# Patient Record
Sex: Female | Born: 1970 | Race: White | Hispanic: No | Marital: Married | Smoking: Never smoker
Health system: Southern US, Community
[De-identification: ages and names within clinical notes are randomized; demographics above are authoritative.]

## PROBLEM LIST (undated history)

## (undated) DIAGNOSIS — R112 Nausea with vomiting, unspecified: Secondary | ICD-10-CM

## (undated) DIAGNOSIS — Z9889 Other specified postprocedural states: Secondary | ICD-10-CM

## (undated) DIAGNOSIS — I499 Cardiac arrhythmia, unspecified: Secondary | ICD-10-CM

## (undated) DIAGNOSIS — D649 Anemia, unspecified: Secondary | ICD-10-CM

## (undated) DIAGNOSIS — R0602 Shortness of breath: Secondary | ICD-10-CM

## (undated) DIAGNOSIS — R011 Cardiac murmur, unspecified: Secondary | ICD-10-CM

## (undated) DIAGNOSIS — K219 Gastro-esophageal reflux disease without esophagitis: Secondary | ICD-10-CM

## (undated) DIAGNOSIS — R51 Headache: Secondary | ICD-10-CM

## (undated) HISTORY — PX: OTHER SURGICAL HISTORY: SHX169

## (undated) HISTORY — PX: COLONOSCOPY: SHX174

## (undated) HISTORY — PX: WISDOM TOOTH EXTRACTION: SHX21

## (undated) HISTORY — PX: BARTHOLIN GLAND CYST EXCISION: SHX565

## (undated) HISTORY — PX: CHOLECYSTECTOMY: SHX55

## (undated) HISTORY — PX: UPPER GI ENDOSCOPY: SHX6162

## (undated) HISTORY — PX: TUBAL LIGATION: SHX77

## (undated) HISTORY — PX: SPHINCTEROTOMY: SHX5279

---

## 1998-02-06 ENCOUNTER — Other Ambulatory Visit: Admission: RE | Admit: 1998-02-06 | Discharge: 1998-02-06 | Payer: Self-pay | Admitting: *Deleted

## 1999-03-13 ENCOUNTER — Other Ambulatory Visit: Admission: RE | Admit: 1999-03-13 | Discharge: 1999-03-13 | Payer: Self-pay | Admitting: Obstetrics and Gynecology

## 2000-03-05 ENCOUNTER — Other Ambulatory Visit: Admission: RE | Admit: 2000-03-05 | Discharge: 2000-03-05 | Payer: Self-pay | Admitting: Obstetrics and Gynecology

## 2000-08-17 ENCOUNTER — Emergency Department (HOSPITAL_COMMUNITY): Admission: EM | Admit: 2000-08-17 | Discharge: 2000-08-17 | Payer: Self-pay | Admitting: *Deleted

## 2001-05-27 ENCOUNTER — Other Ambulatory Visit: Admission: RE | Admit: 2001-05-27 | Discharge: 2001-05-27 | Payer: Self-pay | Admitting: Obstetrics and Gynecology

## 2002-11-27 ENCOUNTER — Other Ambulatory Visit: Admission: RE | Admit: 2002-11-27 | Discharge: 2002-11-27 | Payer: Self-pay | Admitting: Obstetrics and Gynecology

## 2003-12-12 ENCOUNTER — Other Ambulatory Visit: Admission: RE | Admit: 2003-12-12 | Discharge: 2003-12-12 | Payer: Self-pay | Admitting: Obstetrics and Gynecology

## 2004-09-08 ENCOUNTER — Ambulatory Visit (HOSPITAL_COMMUNITY): Admission: RE | Admit: 2004-09-08 | Discharge: 2004-09-08 | Payer: Self-pay | Admitting: Obstetrics and Gynecology

## 2005-02-12 ENCOUNTER — Other Ambulatory Visit: Admission: RE | Admit: 2005-02-12 | Discharge: 2005-02-12 | Payer: Self-pay | Admitting: Obstetrics and Gynecology

## 2006-01-30 ENCOUNTER — Emergency Department (HOSPITAL_COMMUNITY): Admission: EM | Admit: 2006-01-30 | Discharge: 2006-01-30 | Payer: Self-pay | Admitting: Emergency Medicine

## 2006-03-04 ENCOUNTER — Ambulatory Visit (HOSPITAL_COMMUNITY): Admission: RE | Admit: 2006-03-04 | Discharge: 2006-03-04 | Payer: Self-pay | Admitting: Obstetrics and Gynecology

## 2008-09-11 ENCOUNTER — Ambulatory Visit (HOSPITAL_COMMUNITY): Admission: RE | Admit: 2008-09-11 | Discharge: 2008-09-11 | Payer: Self-pay | Admitting: Podiatry

## 2009-06-18 ENCOUNTER — Encounter (INDEPENDENT_AMBULATORY_CARE_PROVIDER_SITE_OTHER): Payer: Self-pay | Admitting: *Deleted

## 2009-07-03 ENCOUNTER — Ambulatory Visit: Payer: Self-pay | Admitting: Gastroenterology

## 2009-07-03 DIAGNOSIS — K625 Hemorrhage of anus and rectum: Secondary | ICD-10-CM

## 2009-07-03 DIAGNOSIS — R1013 Epigastric pain: Secondary | ICD-10-CM

## 2009-07-03 DIAGNOSIS — K3189 Other diseases of stomach and duodenum: Secondary | ICD-10-CM

## 2009-07-19 ENCOUNTER — Ambulatory Visit (HOSPITAL_COMMUNITY): Admission: RE | Admit: 2009-07-19 | Discharge: 2009-07-19 | Payer: Self-pay | Admitting: Gastroenterology

## 2009-07-19 ENCOUNTER — Ambulatory Visit: Payer: Self-pay | Admitting: Gastroenterology

## 2009-08-05 ENCOUNTER — Telehealth (INDEPENDENT_AMBULATORY_CARE_PROVIDER_SITE_OTHER): Payer: Self-pay

## 2009-09-25 ENCOUNTER — Ambulatory Visit: Payer: Self-pay | Admitting: Gastroenterology

## 2009-09-25 DIAGNOSIS — K602 Anal fissure, unspecified: Secondary | ICD-10-CM | POA: Insufficient documentation

## 2009-09-26 ENCOUNTER — Encounter: Payer: Self-pay | Admitting: Gastroenterology

## 2009-10-31 ENCOUNTER — Telehealth (INDEPENDENT_AMBULATORY_CARE_PROVIDER_SITE_OTHER): Payer: Self-pay | Admitting: *Deleted

## 2009-12-10 ENCOUNTER — Ambulatory Visit (HOSPITAL_COMMUNITY): Admission: RE | Admit: 2009-12-10 | Discharge: 2009-12-10 | Payer: Self-pay | Admitting: General Surgery

## 2010-01-12 ENCOUNTER — Emergency Department (HOSPITAL_COMMUNITY): Admission: EM | Admit: 2010-01-12 | Discharge: 2010-01-12 | Payer: Self-pay | Admitting: Emergency Medicine

## 2010-02-20 ENCOUNTER — Encounter (INDEPENDENT_AMBULATORY_CARE_PROVIDER_SITE_OTHER): Payer: Self-pay | Admitting: *Deleted

## 2010-05-26 ENCOUNTER — Encounter: Payer: Self-pay | Admitting: Podiatry

## 2010-06-05 NOTE — Assessment & Plan Note (Signed)
Summary: [name] BLEEDING,GERD   Visit Type:  Initial Consult Referring Provider:  Sherwood Gambler Primary Care Provider:  Phillips Odor, M.D.  Chief Complaint:  abd pain/rectal bleed/gerd.  History of Present Illness: Several months has been using TUMS. Feels like has gas all the time. Chest pain and upr abd pain Epigastric/Chest pain: sharp, no vomiting, mild nausea. No precipitating event: ?stress. No known triggers: ? pizza. Trying to avoid greasy and spicy foods.  Rx for H. pylori gastritis -Biaxin/AMOXIcillin/Aciphex x 7 days. Took every single pill. No problems swallowing. Heartburn better with Aciphex. Carafate actually not taking.  DEC 2010: chest hurt to her back and felt like having heart attack x2. Went on mission trip to [place] but didn't drink the water, but did eat the food.  Happened in [place] and at home. Drinking caffeinated coffee in [place].  Blood in stool every time she goes. Has rectal itching. Doesn't feel any hemorrhoids, ? skin tag. Rare problem with constipation. No straining to have BM.  Rectal bleeding associated with ripping tearing pain. Seen in ED 1 year ago. Rx: Proctofoam. Only uses Tylenol for pain. Ate eggs, bacon, and applesauce-->had to go to [place] and nl stool/bloody.  Preventive Screening-Counseling & Management  Alcohol-Tobacco     Smoking Status: never  Current Medications (verified): 1)  Toviaz 4 Mg Xr24h-Tab (Fesoterodine Fumarate) .... Take 1 Tablet By Mouth Once A Day 2)  Lexapro 10 Mg Tabs (Escitalopram Oxalate) .... Take 1 Tablet By Mouth Once A Day 3)  Aciphex 20 Mg Tbec (Rabeprazole Sodium) .... Take 1 Tablet By Mouth Two Times A Day 4)  Carafate 1 Gm/27ml Susp (Sucralfate) .... Take Two Teaspoons At Bedtime 5)  Chromium .... One Daily  Allergies (verified): 1)  ! Sulfa  Past History:  Past Medical History: Overactive Bladder/weak pelvic muscles "ADD tendencies", Lexapro helps  Past Surgical History: Tubal  Ligation Cholecystectomy-biliary dyskinesia, no stones: Dr. 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  Working on [employer] in [place] since 2008.  Husband had gastric bypass. Smoking Status:  never  Review of Systems       Per HPI, otherwise all systems negative.  No SOB or cough, hemoptysis, diarrhea, dysuria, hematuria, DUB. No ASA, BC, or Goodys, Ibuprofen, Motrin, or Aleve.  Vital Signs:  Patient profile:   40 year old female Height:      63.5 inches Weight:      144 pounds BMI:     25.20 Temp:     98.0 degrees F oral Pulse rate:   80 / minute BP sitting:   100 / 60  (left arm) Cuff size:   regular  Vitals Entered By: Cloria Spring LPN (July 04, 2839 9:17 AM)  Physical Exam  General:  Well developed, well nourished, no acute distress. Head:  Normocephalic and atraumatic. Eyes:  PERRLA, no icterus. Mouth:  No deformity or lesions, dentition normal. Neck:  Supple; no masses. Lungs:  Clear throughout to auscultation. Heart:  Regular rate and rhythm; no murmurs Abdomen:  Soft, mild tenderness in all 4 quadrants without guarding and without rebound and nondistended. No masses or hernias noted. Normal bowel  sounds. Msk:  Symmetrical with no gross deformities. Normal posture. Extremities:  No cyanosis, or edema  noted. Neurologic:  Alert and  oriented x4;  grossly normal neurologically.  Impression & Recommendations:  Problem # 1:  DYSPEPSIA (ICD-536.8)  Differential diagnosis includes reflux esophagitis/uncontrolled GERD, H. pylori gastritis, less likely gastric adeno CA, gastric carcinoid, MALT  lymphoma or eosinophilic gastritis. Follow REFLUX recommendations. HO given. Take Aciphex two times a day. Use Carafate as needed. Plan upr endoscopy on MAR 18. Continue Lexapro. Return visit 2 months.  Orders: Consultation Level V (30865)  Problem # 2:  RECTAL BLEEDING (ICD-569.3)  Most likely 2o to anal fissure and less likely colorectal polyp, malignancy, or hemorrhoids. Use Anamantle two times a day for 14 days. Repeat for 14 additional days if symptoms not resolved. ADD COLACE two times a day to soften stool. AVOID PUSHING STOOL OUT. DO NOT SIT ON TOILET TO READ.Plan lwr endoscopy on MAR 18. Return visit 2 months.  CC: PCP  Total time: 40 minutes for H&P, and explaining etiology of Sx and discussing management of Sx.  Orders: Consultation Level V [phone])  Patient Instructions: 1)  Use Anamantle two times a day for 14 days. 2)  Repeat for 14 additional days if symptoms not resolved. 3)  ADD COLACE two times a day to soften stool. 4)  AVOID PUSHING STOOL OUT. 5)  DO NOT SIT ON TOILET TO READ. 6)  Follow REFLUX recommendations. 7)  Take Aciphex two times a day. 8)  Use Carafate as needed. 9)  Plan upr/lwr endoscopy on MAR 18. 10)  Return visit 2 months. 11)  The medication list was reviewed and reconciled.  All changed / newly prescribed medications were explained.  A complete medication list was provided to the patient / caregiver. Prescriptions: ANAMANTLE HC 3-0.5 % CREA (LIDOCAINE-HYDROCORTISONE ACE) apply to anal canal two times a day for 14 days and may repeat if symptoms not resolved after 14 days  #28 x 1   Entered and Authorized by:   West Bali MD   Signed by:   West Bali MD on 07/03/2009   Method used:   Electronically to        [facility] (retail)       [address] [place], [place]  [address]       Ph: [phone]       Fax: [phone]   RxID:   [id number]

## 2010-06-05 NOTE — Letter (Signed)
Summary: Recall Office Visit  [facility]  [address]   [place], [place] [address]   Phone: [phone]  Fax: [phone]      February 20, 2010   [facility] [address] [place], [place]  [address] [birth year]   Dear Ms. Torrez,   According to our records, it is time for you to schedule a follow-up office visit with [place].   At your convenience, please call [phone] to schedule an office visit. If you have any questions, concerns, or feel that this letter is in error, we would appreciate your call.   Sincerely,    Rosine Beat  [facility] Ph: [phone]   Fax: [phone]

## 2010-06-05 NOTE — Progress Notes (Signed)
Summary: change in appts, not suppose to see Dr. Fernande Boyden  Phone Note From Other Clinic   Caller: Receptionist Reason for Call: Need Referral Information, Schedule Patient Appt Details for Reason: appears we requested pt. appt w/ Dr. Mariel Sleet in error Summary of Call: Spoke with Marcelino Duster at Dr. Thornton Papas office.  She stated that we did notify her that there was a scheduling error; however, she had not been successful in reaching the patient to let her know not to come.  I tried calling the patient to let her know also and LMOVM for her to call back to confirm Initial call taken by: Minna Merritts,  October 31, 2009 4:36 PM

## 2010-06-05 NOTE — Letter (Signed)
Summary: Appointment Reminder  [facility]  [address]   [place], [place] [address]   Phone: [phone]  Fax: [phone]       June 18, 2009   [facility] [address] [place], [place]  [address] 25-Jul-1970    Dear [name],  We have been unable to reach you by phone to schedule a follow up   appointment that was recommended for you by Dr. Darrick Penna. It is very   important that we reach you to schedule an appointment. We hope that you  allow [place] to participate in your health care needs. Please contact us at  [phone] at your earliest convenience to schedule your appointment.  Sincerely,    [facility] R. Roetta Sessions, M.D.    Kassie Mends, M.D. Lorenza Burton, FNP-BC    Tana Coast, PA-C Phone: [phone]    Fax: [phone]

## 2010-06-05 NOTE — Progress Notes (Signed)
Summary: phone message  Phone Note Call from Patient   Caller: Patient Summary of Call: Pt called to say she has completed antibiotics. Wants to know is there anything she needs to do before her appt with Dr. Darrick Penna on 09/25/2009.   Also wants to know if she can have an Rx for Diflucan called to [place]. Said she has the beginnings of a yeast infection from the antibiotics.  Initial call taken by: Cloria Spring LPN,  August 05, 2009 1:52 PM     Appended Document: phone message Please call pt. For the H.pylori gastritis, she should follow the diet associated with improving reflux. Continue Aciphex. Will call in DIFLUCAN 150 mg once, #1 rfxo for her yeast infection. No interaction with her citalopram if she is just taking one dose. (Discusswed with pharmacist, Lorin Picket)  If her rectal Sx are not completely resolved then she should continue the Colace twice a day.  Drink 6-8 cups of water daily.  And repeat Anusol-HC suppositories 1 per rectum twice daily for an additional 14 days. She should follow high-fiber diet. Then she should follow up in MAY 2011. If she thinks she needs to be seen sooner, please call after APR 25. We need to give tghe second course of Anusol HC time to work.  Appended Document: phone message Pt informed. Rx's called to [facility], to April.

## 2010-06-05 NOTE — Letter (Signed)
Summary: SURGICAL REFERRAL  SURGICAL REFERRAL   Imported By: Ave Filter 09/26/2009 11:00:41  _____________________________________________________________________  External Attachment:    Type:   Image     Comment:   External Document  Appended Document: SURGICAL REFERRAL Pt aware.

## 2010-06-05 NOTE — Assessment & Plan Note (Signed)
Summary: ANAL FISSURE, DYSPEPSIA   Visit Type:  Follow-up Visit Primary Care Provider:  Phillips Odor, M.D.  Chief Complaint:  F/U dyspepsia/ rectal bleeding.  History of Present Illness: Increasing fiber. Still having a tear. Even painful with diarrhea.  Still having pain in rectum passing stool after AnaMantale for 14 days and Anusol HC for 14 days. Anusol HC gave her terrible gas. Dyspepsia and upr discomfort is gone.   Current Medications (verified): 1)  Toviaz 4 Mg Xr24h-Tab (Fesoterodine Fumarate) .... Take 1 Tablet By Mouth Once A Day 2)  Lexapro 10 Mg Tabs (Escitalopram Oxalate) .... Take 1 Tablet By Mouth Once A Day 3)  Aciphex 20 Mg Tbec (Rabeprazole Sodium) .... Take 1 Tablet By Mouth Two Times A Day 4)  Carafate 1 Gm/30ml Susp (Sucralfate) .... As Needed 5)  Chromium .... One Daily  Allergies (verified): 1)  ! Sulfa  Past History:  Past Medical History: Last updated: 07/03/2009 Overactive Bladder/weak pelvic muscles "ADD tendencies", Lexapro helps  Past Surgical History: Reviewed history from 07/03/2009 and no changes required. Tubal Ligation Cholecystectomy-biliary dyskinesia, no stones: Dr. Maple [place]  Vital Signs:  Patient profile:   40 year old female Height:      63.5 inches Weight:      147.50 pounds BMI:     25.81 Temp:     97.9 degrees F oral Pulse rate:   72 / minute BP sitting:   100 / 60  (left arm) Cuff size:   regular  Vitals Entered By: Cloria Spring LPN (Sep 25, 2009 4:05 PM)  Physical Exam  General:  Well developed, well nourished, no acute distress. Head:  Normocephalic and atraumatic. Eyes:  PERRLA, no icterus. Lungs:  Clear throughout to auscultation. Heart:  Regular rate and rhythm; no murmurs. Abdomen:  Soft, MILD TTP IN RIGHT PERIMUMBILICAL REGION, nondistended. Normal bowel sounds.  Impression & Recommendations:  Problem # 1:  ANAL FISSURE (ICD-565.0)  Failed medical management. TCS up to date. Refer for fissurectomy:  Jenkins/Ziegler JULY 2011 per pt request. May use Viscous Lidocaine prior to BMs to decrease pain. No need to bulk up stools at this time. discuss NTG ointment and side effects. Pt declines rx at this time. OPV in 6 mos.  CC: PCP and Dr. Leticia Penna  Orders: Est. Patient Level III ([address])  Problem # 2:  DYSPEPSIA (ICD-536.8) Assessment: Improved Coninue Aciphex and Carafate as needed. Prescriptions: LIDOCAINE VISCOUS 2 % SOLN (LIDOCAINE HCL) 1/2 tsp q2-4 hours top to anus as needed for rectal pain  #50 ml x 1   Entered and Authorized by:   West Bali MD   Signed by:   West Bali MD on 09/25/2009   Method used:   Electronically to        [facility] (retail)       [address] [place], [place]  [address]       Ph: [phone]       Fax: [phone]   RxID:   [id number]   Appended Document: ANAL FISSURE, DYSPEPSIA REMINDER IN COMPUTER

## 2010-06-05 NOTE — Letter (Signed)
Summary: TCS/EGD ORDER  TCS/EGD ORDER   Imported By: Ave Filter 07/03/2009 10:06:56  _____________________________________________________________________  External Attachment:    Type:   Image     Comment:   External Document

## 2010-07-18 LAB — CBC
MCH: 28.2 pg (ref 26.0–34.0)
MCV: 82.2 fL (ref 78.0–100.0)
Platelets: 277 10*3/uL (ref 150–400)
RBC: 4.26 MIL/uL (ref 3.87–5.11)
RDW: 12.7 % (ref 11.5–15.5)

## 2010-07-18 LAB — BASIC METABOLIC PANEL
BUN: 10 mg/dL (ref 6–23)
CO2: 27 mEq/L (ref 19–32)
Chloride: 105 mEq/L (ref 96–112)
Creatinine, Ser: 0.8 mg/dL (ref 0.4–1.2)

## 2010-07-18 LAB — DIFFERENTIAL
Eosinophils Absolute: 0.1 10*3/uL (ref 0.0–0.7)
Eosinophils Relative: 1 % (ref 0–5)
Lymphs Abs: 2.5 10*3/uL (ref 0.7–4.0)

## 2010-09-19 NOTE — Op Note (Signed)
NAMEJALAH, [name]            ACCOUNT NO.:  [id number]   MEDICAL RECORD NO.:  [id number]          PATIENT TYPE:  AMB   LOCATION:  SDC                           [facility]  [facility]   PHYSICIAN:  Duke Salvia. Marcelle Overlie, M.D.DATE OF BIRTH:  07/21/1970   DATE OF PROCEDURE:  09/08/2004  DATE OF DISCHARGE:                                 OPERATIVE REPORT   PREOPERATIVE DIAGNOSIS:  Request permanent sterilization.   POSTOPERATIVE DIAGNOSIS:  Request permanent sterilization.   PROCEDURE:  Laparoscopic tubal by Filshie clip application.   SURGEON:  Duke Salvia. Marcelle Overlie, M.D.   ANESTHESIA:  General endotracheal.   COMPLICATIONS:  None.   DRAINS:  In-and-out Foley catheter.   ESTIMATED BLOOD LOSS:  Minimal.   DESCRIPTION OF PROCEDURE:  The patient was taken to the operating room and  after an adequate level of general endotracheal anesthesia was obtained with  the patient's legs in stirrups, the abdomen, perineum, and vagina were  prepped and draped in the usual sterile fashion for a laparoscopy.  The  bladder was drained.  EUA carried out.  Uterus midposition and normal size.  Adnexa negative.  Hulka tenaculum was positioned.  Attention directed to the  abdomen.  The subumbilical area was infiltrated with 0.5% Marcaine plain.  Small incision was made.  The Veress needle introduced without difficulty.  It's intra-abdominal position was verified by pressure and water testing.  After 2.5 liter pneumoperitoneum was then created, the laparoscopic trocar  and sleeve were then introduced without difficulty.  Initial inspection  revealed no abnormalities noted in the pelvis.  With the patient in  Trendelenburg and the uterus anteflexed, 4 to 5 mL of 0.5% Marcaine plain  were then dripped across each tube from the cornu to the fimbriated end.  Filshie clip applicator was back-loaded.  Starting on the right, Filshie  clip was applied at a right angle 2 cm from the cornu on the right with  excellent  application.  The exact same repeated on the left.  Careful  inspection on the left, the entire tube was not engulfed into the clip, so a  second application was placed proximal with complete occlusion of the tube  with excellent application.  Both tubes were inspected and noted to be  excellent application on either side.  No other abnormalities were noted.  Instruments were removed.  Photographs were taken.  The gas was allowed to  escape.  The defect was closed with 4-0 Dexon subcuticular suture.  She  tolerated this well and went to the recovery room in good condition.    RMH/MEDQ  D:  09/08/2004  T:  09/08/2004  Job:  [id number]

## 2010-09-19 NOTE — Op Note (Signed)
[name], [name]            ACCOUNT NO.:  [id number]   MEDICAL RECORD NO.:  [id number]          PATIENT TYPE:  AMB   LOCATION:  SDC                           [facility]   PHYSICIAN:  Duke Salvia. Marcelle Overlie, M.D.DATE OF BIRTH:  [birth year]   DATE OF PROCEDURE:  03/04/2006  DATE OF DISCHARGE:                                 OPERATIVE REPORT   PREOPERATIVE DIAGNOSIS:  Recurrent left Bartholin's cyst/abscess.   POSTOPERATIVE DIAGNOSIS:  Recurrent left Bartholin's cyst/abscess.   PROCEDURE:  Marsupialization of left Bartholin cyst/abscess.   SURGEON:  Duke Salvia. Marcelle Overlie, M.D.   ANESTHESIA:  Local plus general.   ESTIMATED BLOOD LOSS:  Minimal.   SPECIMENS:  None.   PROCEDURE AND FINDINGS:  The patient was taken to the operating room.  After  an adequate level of general anesthesia was obtained with the patient's legs  in stirrups, the perineum and vagina was prepped with Betadine.  The bladder  was drained.  A 2-3 cm palpable left Bartholin's cyst was infiltrated with  0.5% Marcaine plain.  A #11 blade was then used to incise the mucocutaneous  margin for approximately 2 cm draining some old purulent material.  There  was no odor.  The cavity was explored and noted to be just a unilocular  cyst.  The cyst wall was grasped with hemostats and was suture  circumferentially with 3-0 Vicryl suture to the external skin.  Packed with  Iodoform.  She tolerated this well and went to the recovery room in good  condition.      Richard M. Marcelle Overlie, M.D.  Electronically Signed     RMH/MEDQ  D:  03/04/2006  T:  03/04/2006  Job:  [id number]

## 2010-09-19 NOTE — H&P (Signed)
[name], [name]            ACCOUNT NO.:  [id number]   MEDICAL RECORD NO.:  [id number]          PATIENT TYPE:  AMB   LOCATION:  SDC                           [facility]   PHYSICIAN:  Duke Salvia. Marcelle Overlie, M.D.DATE OF BIRTH:  Oct 05, 1970   DATE OF ADMISSION:  DATE OF DISCHARGE:                                HISTORY & PHYSICAL   DATE OF OUTPATIENT SURGERY:  Sep 08, 2004   CHIEF COMPLAINT:  Requests permanent sterilization.   HISTORY OF PRESENT ILLNESS:  A 40 year old G2 P2 who has a 16- and 9-year-  old presents for LTC, currently using Ortho Evra for contraception. This  procedure including the risks of bleeding, infection, adjacent organ injury,  the possible need for open or additional surgery, the permanence of the  procedure, and failure rate of two to three per thousand are reviewed with  her, which she understands and accepts.   PAST MEDICAL HISTORY:  Allergies:  None. Obstetric history:  Two vaginal  deliveries at term without complication. Other surgery:  None.   HISTORY:  Significant for a father with diabetes, hypertension, and heart  disease. Also, father with prostate cancer.   PHYSICAL EXAMINATION:  VITAL SIGNS:  Temperature 98.2, blood pressure 86/60.  HEENT:  Unremarkable.  NECK:  Supple without masses.  LUNGS:  Clear.  CARDIOVASCULAR:  Regular rate and rhythm without murmurs, rubs, or gallops  noted.  BREASTS:  Without masses.  ABDOMEN:  Soft, flat, nontender.  PELVIC:  Normal external genitalia, vagina and cervix clear. Uterus mid  position, normal size. Adnexa negative.  EXTREMITIES AND NEUROLOGIC:  Unremarkable.   IMPRESSION:  Requests LTC.   PLAN:  Tubal ligation by Filshie clip application. Procedure and risks  reviewed as above.      RMH/MEDQ  D:  09/02/2004  T:  09/02/2004  Job:  [id number]

## 2010-09-19 NOTE — H&P (Signed)
[name], [name]            ACCOUNT NO.:  [id number]   MEDICAL RECORD NO.:  [id number]          PATIENT TYPE:  AMB   LOCATION:  SDC                           [facility]   PHYSICIAN:  Duke Salvia. Marcelle Overlie, M.D.DATE OF BIRTH:  1970/05/09   DATE OF ADMISSION:  DATE OF DISCHARGE:                                HISTORY & PHYSICAL   CHIEF COMPLAINT:  Recurrent left Bartholin's cyst.   HISTORY OF PRESENT ILLNESS:  A 40 year old who has had a prior tubal and  recently had a left Bartholin's abscess/cyst and was treated with a Word  catheter, but has recurred causing discomfort and not responding to oral  antibiotics.  She presents for outpatient marsupialization of the  Bartholin's cyst.   PAST OBSTETRICAL HISTORY:  She is a gravida 2, para 2.  She has had a prior  tubal.   ALLERGIES:  SULFA.   PAST SURGICAL HISTORY:  Laparoscopic cholecystectomy.   FAMILY HISTORY:  Significant for father with prostate cancer, hypertension,  and heart disease.   PHYSICAL EXAMINATION:  VITAL SIGNS:  Temperature 98.2, blood pressure  120/70.  HEENT:  Unremarkable.  NECK:  Supple without masses.  LUNGS:  Clear.  CARDIOVASCULAR:  Regular rate and rhythm without murmurs, rubs, or gallops  noted.  BREASTS:  Without masses.  ABDOMEN:  Soft, flat, and nontender.  PELVIC:  External genitalia reveals a draining 2 cm left Bartholin's cyst.  Vagina and cervix normal.  Uterus midposition and normal size.  Adnexa  negative.  EXTREMITIES:  NEUROLOGY:  Unremarkable.   IMPRESSION:  Recurrent left Bartholin's cyst.   PLAN:  Outpatient marsupialization.  The procedure including risks of  bleeding, infection, and recurrence of the cyst were all reviewed with her  which she understands and accepts.      Richard M. Marcelle Overlie, M.D.  Electronically Signed     RMH/MEDQ  D:  03/02/2006  T:  03/02/2006  Job:  [id number]

## 2011-09-24 ENCOUNTER — Other Ambulatory Visit: Payer: Self-pay | Admitting: Obstetrics and Gynecology

## 2011-11-16 ENCOUNTER — Other Ambulatory Visit: Payer: Self-pay | Admitting: Obstetrics and Gynecology

## 2011-11-16 DIAGNOSIS — R928 Other abnormal and inconclusive findings on diagnostic imaging of breast: Secondary | ICD-10-CM

## 2011-11-23 ENCOUNTER — Ambulatory Visit
Admission: RE | Admit: 2011-11-23 | Discharge: 2011-11-23 | Disposition: A | Discharge status: Home / Self Care | Payer: 59 | Source: Ambulatory Visit | Attending: Obstetrics and Gynecology | Admitting: Obstetrics and Gynecology

## 2011-11-23 ENCOUNTER — Other Ambulatory Visit: Payer: Self-pay | Admitting: Obstetrics and Gynecology

## 2011-11-23 DIAGNOSIS — R928 Other abnormal and inconclusive findings on diagnostic imaging of breast: Secondary | ICD-10-CM

## 2011-11-30 ENCOUNTER — Ambulatory Visit
Admission: RE | Admit: 2011-11-30 | Discharge: 2011-11-30 | Disposition: A | Discharge status: Home / Self Care | Payer: 59 | Source: Ambulatory Visit | Attending: Obstetrics and Gynecology | Admitting: Obstetrics and Gynecology

## 2011-11-30 DIAGNOSIS — R928 Other abnormal and inconclusive findings on diagnostic imaging of breast: Secondary | ICD-10-CM

## 2012-11-11 ENCOUNTER — Other Ambulatory Visit: Payer: Self-pay

## 2012-11-11 DIAGNOSIS — Z1231 Encounter for screening mammogram for malignant neoplasm of breast: Secondary | ICD-10-CM

## 2012-12-05 ENCOUNTER — Ambulatory Visit: Payer: 59

## 2012-12-15 ENCOUNTER — Ambulatory Visit
Admission: RE | Admit: 2012-12-15 | Discharge: 2012-12-15 | Disposition: A | Discharge status: Home / Self Care | Payer: 59 | Source: Ambulatory Visit

## 2012-12-15 DIAGNOSIS — Z1231 Encounter for screening mammogram for malignant neoplasm of breast: Secondary | ICD-10-CM

## 2013-05-22 ENCOUNTER — Encounter (HOSPITAL_COMMUNITY): Payer: Self-pay | Admitting: Emergency Medicine

## 2013-05-22 ENCOUNTER — Emergency Department (HOSPITAL_COMMUNITY)
Admission: EM | Admit: 2013-05-22 | Discharge: 2013-05-22 | Disposition: A | Discharge status: Home / Self Care | Payer: 59 | Attending: Emergency Medicine | Admitting: Emergency Medicine

## 2013-05-22 DIAGNOSIS — Z79899 Other long term (current) drug therapy: Secondary | ICD-10-CM | POA: Insufficient documentation

## 2013-05-22 DIAGNOSIS — R112 Nausea with vomiting, unspecified: Secondary | ICD-10-CM

## 2013-05-22 DIAGNOSIS — B349 Viral infection, unspecified: Secondary | ICD-10-CM

## 2013-05-22 DIAGNOSIS — R630 Anorexia: Secondary | ICD-10-CM | POA: Insufficient documentation

## 2013-05-22 DIAGNOSIS — H9209 Otalgia, unspecified ear: Secondary | ICD-10-CM | POA: Insufficient documentation

## 2013-05-22 DIAGNOSIS — B9789 Other viral agents as the cause of diseases classified elsewhere: Secondary | ICD-10-CM | POA: Insufficient documentation

## 2013-05-22 DIAGNOSIS — R109 Unspecified abdominal pain: Secondary | ICD-10-CM | POA: Insufficient documentation

## 2013-05-22 DIAGNOSIS — R6889 Other general symptoms and signs: Secondary | ICD-10-CM

## 2013-05-22 DIAGNOSIS — M2550 Pain in unspecified joint: Secondary | ICD-10-CM | POA: Insufficient documentation

## 2013-05-22 DIAGNOSIS — J111 Influenza due to unidentified influenza virus with other respiratory manifestations: Secondary | ICD-10-CM | POA: Insufficient documentation

## 2013-05-22 LAB — COMPREHENSIVE METABOLIC PANEL
ALK PHOS: 58 U/L (ref 39–117)
ALT: 26 U/L (ref 0–35)
AST: 28 U/L (ref 0–37)
Albumin: 4.5 g/dL (ref 3.5–5.2)
BILIRUBIN TOTAL: 0.9 mg/dL (ref 0.3–1.2)
BUN: 14 mg/dL (ref 6–23)
CO2: 24 meq/L (ref 19–32)
Calcium: 9.7 mg/dL (ref 8.4–10.5)
Chloride: 100 mEq/L (ref 96–112)
Creatinine, Ser: 0.8 mg/dL (ref 0.50–1.10)
GFR, EST NON AFRICAN AMERICAN: 88 mL/min — AB (ref >90)
GLUCOSE: 91 mg/dL (ref 70–99)
POTASSIUM: 3.6 meq/L — AB (ref 3.7–5.3)
SODIUM: 139 meq/L (ref 137–147)
Total Protein: 7.9 g/dL (ref 6.0–8.3)

## 2013-05-22 LAB — URINALYSIS, ROUTINE W REFLEX MICROSCOPIC
Glucose, UA: NEGATIVE mg/dL
Hgb urine dipstick: NEGATIVE
KETONES UR: NEGATIVE mg/dL
LEUKOCYTES UA: NEGATIVE
NITRITE: NEGATIVE
PROTEIN: NEGATIVE mg/dL
Specific Gravity, Urine: 1.038 — ABNORMAL HIGH (ref 1.005–1.030)
UROBILINOGEN UA: 1 mg/dL (ref 0.0–1.0)
pH: 6.5 (ref 5.0–8.0)

## 2013-05-22 LAB — CBC
HEMATOCRIT: 41.7 % (ref 36.0–46.0)
HEMOGLOBIN: 13.9 g/dL (ref 12.0–15.0)
MCH: 28.2 pg (ref 26.0–34.0)
MCHC: 33.3 g/dL (ref 30.0–36.0)
MCV: 84.6 fL (ref 78.0–100.0)
Platelets: 243 10*3/uL (ref 150–400)
RBC: 4.93 MIL/uL (ref 3.87–5.11)
RDW: 12.9 % (ref 11.5–15.5)
WBC: 9.1 10*3/uL (ref 4.0–10.5)

## 2013-05-22 MED ORDER — SODIUM CHLORIDE 0.9 % IV BOLUS (SEPSIS)
1000.0000 mL | Freq: Once | INTRAVENOUS | Status: AC
  Administered 2013-05-22: 1000 mL via INTRAVENOUS

## 2013-05-22 MED ORDER — ONDANSETRON 4 MG PO TBDP: ORAL_TABLET | ORAL | Status: DC

## 2013-05-22 MED ORDER — ONDANSETRON HCL 4 MG/2ML IJ SOLN
4.0000 mg | Freq: Once | INTRAMUSCULAR | Status: AC
  Administered 2013-05-22: 4 mg via INTRAVENOUS
  Filled 2013-05-22: qty 2

## 2013-05-22 NOTE — ED Notes (Signed)
Pt has had fever and diarrhea since yesterday.  Spouse treated recently with flu also.  Pt c/o generalized body aches and pain.

## 2013-05-22 NOTE — Discharge Instructions (Signed)
Use zofran as directed as needed for nausea. Stay well hydrated.  Nausea and Vomiting Nausea is a sick feeling that often comes before throwing up (vomiting). Vomiting is a reflex where stomach contents come out of your mouth. Vomiting can cause severe loss of body fluids (dehydration). Children and elderly adults can become dehydrated quickly, especially if they also have diarrhea. Nausea and vomiting are symptoms of a condition or disease. It is important to find the cause of your symptoms. CAUSES   Direct irritation of the stomach lining. This irritation can result from increased acid production (gastroesophageal reflux disease), infection, food poisoning, taking certain medicines (such as nonsteroidal anti-inflammatory drugs), alcohol use, or tobacco use.  Signals from the brain.These signals could be caused by a headache, heat exposure, an inner ear disturbance, increased pressure in the brain from injury, infection, a tumor, or a concussion, pain, emotional stimulus, or metabolic problems.  An obstruction in the gastrointestinal tract (bowel obstruction).  Illnesses such as diabetes, hepatitis, gallbladder problems, appendicitis, kidney problems, cancer, sepsis, atypical symptoms of a heart attack, or eating disorders.  Medical treatments such as chemotherapy and radiation.  Receiving medicine that makes you sleep (general anesthetic) during surgery. DIAGNOSIS Your caregiver may ask for tests to be done if the problems do not improve after a few days. Tests may also be done if symptoms are severe or if the reason for the nausea and vomiting is not clear. Tests may include:  Urine tests.  Blood tests.  Stool tests.  Cultures (to look for evidence of infection).  X-rays or other imaging studies. Test results can help your caregiver make decisions about treatment or the need for additional tests. TREATMENT You need to stay well hydrated. Drink frequently but in small amounts.You  may wish to drink water, sports drinks, clear broth, or eat frozen ice pops or gelatin dessert to help stay hydrated.When you eat, eating slowly may help prevent nausea.There are also some antinausea medicines that may help prevent nausea. HOME CARE INSTRUCTIONS   Take all medicine as directed by your caregiver.  If you do not have an appetite, do not force yourself to eat. However, you must continue to drink fluids.  If you have an appetite, eat a normal diet unless your caregiver tells you differently.  Eat a variety of complex carbohydrates (rice, wheat, potatoes, bread), lean meats, yogurt, fruits, and vegetables.  Avoid high-fat foods because they are more difficult to digest.  Drink enough water and fluids to keep your urine clear or pale yellow.  If you are dehydrated, ask your caregiver for specific rehydration instructions. Signs of dehydration may include:  Severe thirst.  Dry lips and mouth.  Dizziness.  Dark urine.  Decreasing urine frequency and amount.  Confusion.  Rapid breathing or pulse. SEEK IMMEDIATE MEDICAL CARE IF:   You have blood or brown flecks (like coffee grounds) in your vomit.  You have black or bloody stools.  You have a severe headache or stiff neck.  You are confused.  You have severe abdominal pain.  You have chest pain or trouble breathing.  You do not urinate at least once every 8 hours.  You develop cold or clammy skin.  You continue to vomit for longer than 24 to 48 hours.  You have a fever. MAKE SURE YOU:   Understand these instructions.  Will watch your condition.  Will get help right away if you are not doing well or get worse. Document Released: 04/20/2005 Document Revised: 07/13/2011 Document Reviewed:  09/17/2010 [employer] Patient Information 2014 [place], [place].  Viral Infections A viral infection can be caused by different types of viruses.Most viral infections are not serious and resolve on their own.  However, some infections may cause severe symptoms and may lead to further complications. SYMPTOMS Viruses can frequently cause:  Minor sore throat.  Aches and pains.  Headaches.  Runny nose.  Different types of rashes.  Watery eyes.  Tiredness.  Cough.  Loss of appetite.  Gastrointestinal infections, resulting in nausea, vomiting, and diarrhea. These symptoms do not respond to antibiotics because the infection is not caused by bacteria. However, you might catch a bacterial infection following the viral infection. This is sometimes called a "superinfection." Symptoms of such a bacterial infection may include:  Worsening sore throat with pus and difficulty swallowing.  Swollen neck glands.  Chills and a high or persistent fever.  Severe headache.  Tenderness over the sinuses.  Persistent overall ill feeling (malaise), muscle aches, and tiredness (fatigue).  Persistent cough.  Yellow, green, or brown mucus production with coughing. HOME CARE INSTRUCTIONS   Only take over-the-counter or prescription medicines for pain, discomfort, diarrhea, or fever as directed by your caregiver.  Drink enough water and fluids to keep your urine clear or pale yellow. Sports drinks can provide valuable electrolytes, sugars, and hydration.  Get plenty of rest and maintain proper nutrition. Soups and broths with crackers or rice are fine. SEEK IMMEDIATE MEDICAL CARE IF:   You have severe headaches, shortness of breath, chest pain, neck pain, or an unusual rash.  You have uncontrolled vomiting, diarrhea, or you are unable to keep down fluids.  You or your child has an oral temperature above 102 F (38.9 C), not controlled by medicine.  Your baby is older than 3 months with a rectal temperature of 102 F (38.9 C) or higher.  Your baby is 72 months old or younger with a rectal temperature of 100.4 F (38 C) or higher. MAKE SURE YOU:   Understand these instructions.  Will watch  your condition.  Will get help right away if you are not doing well or get worse. Document Released: 01/28/2005 Document Revised: 07/13/2011 Document Reviewed: 08/25/2010 [facility] Patient Information 2014 [place], [place].

## 2013-05-22 NOTE — ED Provider Notes (Signed)
CSN: [id number]     Arrival date & time 05/22/13  1137 History   First MD Initiated Contact with Patient 05/22/13 1208     Chief Complaint  Patient presents with  . Fever  . Diarrhea   (Consider location/radiation/quality/duration/timing/severity/associated sxs/prior Treatment) HPI Comments: Pt is a 43 y/o female who presents to the ED complaining of fever, nausea, vomiting, diarrhea and body aches x 2 days. Pt states between yesterday and today she had a fever of 101, vomited about 6-7 times, had a few epodes of non-bloody diarrhea. Feels as if her abdomen is cramping. She has been taking tylenol with temporary relief of fever. Also states she has been congested, has pressure behind her sinuses, ear fullness. Her husband was recently told he had the flu. Denies chest pain, sob, cough.  Patient is a 43 y.o. female presenting with fever and diarrhea. The history is provided by the patient and the spouse.  Fever Associated symptoms: congestion, diarrhea, ear pain, myalgias, nausea and vomiting   Diarrhea Associated symptoms: abdominal pain, arthralgias, fever, myalgias and vomiting     History reviewed. No pertinent past medical history. Past Surgical History  Procedure Laterality Date  . Cholecystectomy    . Tubal ligation    . Bartholin gland cyst excision    . Uterine ablation     History reviewed. No pertinent family history. History  Substance Use Topics  . Smoking status: Never Smoker   . Smokeless tobacco: Not on file  . Alcohol Use: Yes     Comment: social   OB History   Grav Para Term Preterm Abortions TAB SAB Ect Mult Living                 Review of Systems  Constitutional: Positive for fever and appetite change.  HENT: Positive for congestion, ear pain and sinus pressure.   Gastrointestinal: Positive for nausea, vomiting, abdominal pain and diarrhea.  Musculoskeletal: Positive for arthralgias and myalgias.  All other systems reviewed and are  negative.    Allergies  Sulfonamide derivatives  Home Medications   Current Outpatient Rx  Name  Route  Sig  Dispense  Refill  . acetaminophen (TYLENOL) 500 MG tablet   Oral   Take 1,000 mg by mouth every 6 (six) hours as needed.         Marland Kitchen tetrahydrozoline (VISINE) 0.05 % ophthalmic solution   Both Eyes   Place 1 drop into both eyes 2 (two) times daily.         . ondansetron (ZOFRAN ODT) 4 MG disintegrating tablet      4mg  ODT q4 hours prn nausea/vomit   10 tablet   0    BP 94/62  Pulse 80  Temp(Src) 98.6 F (37 C) (Oral)  Resp 16  SpO2 100% Physical Exam  Nursing note and vitals reviewed. Constitutional: She is oriented to person, place, and time. She appears well-developed and well-nourished. No distress.  HENT:  Head: Normocephalic and atraumatic.  Right Ear: Tympanic membrane and ear canal normal.  Left Ear: Tympanic membrane and ear canal normal.  Nose: Mucosal edema present. Right sinus exhibits maxillary sinus tenderness and frontal sinus tenderness. Left sinus exhibits maxillary sinus tenderness and frontal sinus tenderness.  Mouth/Throat: Uvula is midline and mucous membranes are normal. Posterior oropharyngeal erythema present. No oropharyngeal exudate or posterior oropharyngeal edema.  Eyes: Conjunctivae are normal. No scleral icterus.  Neck: Normal range of motion. Neck supple.  Cardiovascular: Normal rate, regular rhythm, normal heart sounds and  intact distal pulses.   Pulmonary/Chest: Effort normal and breath sounds normal. No respiratory distress. She has no wheezes. She has no rales. She exhibits no tenderness.  Abdominal: Soft. Normal appearance and bowel sounds are normal. She exhibits no distension. There is tenderness (mild). There is no rigidity, no rebound and no guarding.  No peritoneal signs.  Musculoskeletal: Normal range of motion. She exhibits no edema.  Neurological: She is alert and oriented to person, place, and time.  Skin: Skin is  warm and dry. She is not diaphoretic.  Psychiatric: She has a normal mood and affect. Her behavior is normal.    ED Course  Procedures (including critical care time) Labs Review Labs Reviewed  COMPREHENSIVE METABOLIC PANEL - Abnormal; Notable for the following:    Potassium 3.6 (*)    GFR calc non Af Amer 88 (*)    All other components within normal limits  URINALYSIS, ROUTINE W REFLEX MICROSCOPIC - Abnormal; Notable for the following:    Color, Urine AMBER (*)    Specific Gravity, Urine 1.038 (*)    Bilirubin Urine SMALL (*)    All other components within normal limits  CBC   Imaging Review No results found.  EKG Interpretation   None       MDM   1. Flu-like symptoms   2. Nausea and vomiting   3. Viral syndrome     Pt presenting with flu-like symptoms, n/v/d, congestion, sinus pressure. She is well appearing and in NAD, normal VS. Plan to give IV fluids, zofran. Labs pending. 3:23 PM Labs without acute finding. UA showing slight dehydration. Slight improvement with IV fluids. Vitals remain stable. She is stable for discharge. Symptomatic tx discussed. Will d/c with zofran. Return precautions given. Patient states understanding of treatment care plan and is agreeable.   Trevor MaceRobyn M Albert, PA-C 05/22/13 1525

## 2013-05-22 NOTE — ED Provider Notes (Signed)
Medical screening examination/treatment/procedure(s) were performed by non-physician practitioner and as supervising physician I was immediately available for consultation/collaboration.  EKG Interpretation   None        Gilda Creasehristopher J. Estefania Kamiya, MD 05/22/13 [phone]

## 2013-07-26 NOTE — H&P (Signed)
Delton SeeKimberly J Josephs  DICTATION # [id number] CSN# [id number]   Meriel PicaHOLLAND,Marcos Peloso M, MD 07/26/2013 9:54 AM

## 2013-07-27 ENCOUNTER — Encounter (HOSPITAL_COMMUNITY): Payer: Self-pay | Admitting: Pharmacist

## 2013-07-28 NOTE — H&P (Signed)
NAMChristiane Allen:  Allen, Beverly            ACCOUNT NO.:  [id number]  MEDICAL RECORD NO.:  [id number]  LOCATION:                                 FACILITY:  PHYSICIAN:  Duke Salviaichard M. Marcelle OverlieHolland, M.D.DATE OF BIRTH:  04/10/1971  DATE OF ADMISSION:  08/07/2013 DATE OF DISCHARGE:                             HISTORY & PHYSICAL   CHIEF COMPLAINT:  Pelvic pain, dyspareunia.  HISTORY OF PRESENT ILLNESS:  A 43 year old, G2, P2, prior tubal, this patient underwent endometrial ablation about a year ago, did well initially, but over the last 4-6 months has experienced increased pain during her period.  One day of heavier flow and deep pain with intercourse.  Followup exam in our office at 2:15, uterus is mid position, normal size, adnexa negative.  Of note at the time of her last ultrasound July 13, 2013, had some changes of possible adenomyosis. Her pain has progressed to the point that she prefers to have definitive hysterectomy.  The procedure of LAVH discussed in detail including specific risks related to bleeding, infection, transfusion, adjacent organ injury, wound infection, phlebitis, possible need to complete the surgery by open technique along with her expected recovery time reviewed.  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  CURRENT MEDICATIONS:  None.  SURGICAL HISTORY:  Two vaginal deliveries, cholecystectomy, marsupialization of the Bartholin's gland, tubal ligation, NovaSure ablation.  FAMILY HISTORY:  Significant for heart disease asthma, anemia, kidney disease, UTI, osteoporosis, arthritis, diabetes, hypertension, bladder, lung, and prostate cancer.  SOCIAL HISTORY:  Denies tobacco or drug use.  She does drink 1 alcoholic drink every other day.  Dr. Assunta FoundJohn Golding is her medical doctor.  Last Pap January 2015 was normal.  PHYSICAL EXAMINATION:  VITAL SIGNS:  Temp 98.2, blood pressure 120/72. HEENT:  Unremarkable. NECK:  Supple without masses. LUNGS:  Clear. CARDIOVASCULAR:   Regular rate and rhythm without murmurs, rubs, or gallops. BREASTS:  Without masses. ABDOMEN:  Soft, flat, nontender. GU:  Vulva, vagina, cervix normal.  Uterus mid position, normal size. Adnexa negative.  EXTREMITIES:  Unremarkable. NEUROLOGIC:  Unremarkable.  IMPRESSION:  Pelvic pain, dyspareunia status post endometrial ablation, possible adenomyosis.  PLAN:  LAVH, procedure and risks reviewed as above.     Beren Yniguez M. Marcelle OverlieHolland, M.D.     RMH/MEDQ  D:  07/26/2013  T:  07/26/2013  Job:  [id number]

## 2013-08-02 ENCOUNTER — Other Ambulatory Visit (HOSPITAL_COMMUNITY): Payer: 59

## 2013-08-02 ENCOUNTER — Encounter (HOSPITAL_COMMUNITY)
Admission: RE | Admit: 2013-08-02 | Discharge: 2013-08-02 | Disposition: A | Discharge status: Home / Self Care | Payer: 59 | Source: Ambulatory Visit | Attending: Obstetrics and Gynecology | Admitting: Obstetrics and Gynecology

## 2013-08-02 ENCOUNTER — Encounter (HOSPITAL_COMMUNITY): Payer: Self-pay

## 2013-08-02 DIAGNOSIS — Z01812 Encounter for preprocedural laboratory examination: Secondary | ICD-10-CM | POA: Insufficient documentation

## 2013-08-02 HISTORY — DX: Anemia, unspecified: D64.9

## 2013-08-02 HISTORY — DX: Headache: R51

## 2013-08-02 HISTORY — DX: Cardiac arrhythmia, unspecified: I49.9

## 2013-08-02 HISTORY — DX: Gastro-esophageal reflux disease without esophagitis: K21.9

## 2013-08-02 HISTORY — DX: Cardiac murmur, unspecified: R01.1

## 2013-08-02 HISTORY — DX: Other specified postprocedural states: R11.2

## 2013-08-02 HISTORY — DX: Shortness of breath: R06.02

## 2013-08-02 HISTORY — DX: Nausea with vomiting, unspecified: Z98.890

## 2013-08-02 LAB — CBC
HCT: 36.7 % (ref 36.0–46.0)
Hemoglobin: 12.7 g/dL (ref 12.0–15.0)
MCH: 29.5 pg (ref 26.0–34.0)
MCHC: 34.6 g/dL (ref 30.0–36.0)
MCV: 85.3 fL (ref 78.0–100.0)
PLATELETS: 273 10*3/uL (ref 150–400)
RBC: 4.3 MIL/uL (ref 3.87–5.11)
RDW: 12.9 % (ref 11.5–15.5)
WBC: 7.7 10*3/uL (ref 4.0–10.5)

## 2013-08-02 NOTE — Patient Instructions (Signed)
Your procedure is scheduled on: Monday, August 07, 2013  [facility] at: 6:00am  Pick up the phone at the desk and dial [phone].  Call this number if you have problems the morning of surgery: [phone].  Remember: Do NOT eat food: After midnight Sunday Do NOT drink clear liquids after: After midnight Sunday Take these medicines the morning of surgery with a SIP OF WATER: NONE  Do NOT wear jewelry (body piercing), metal hair clips/bobby pins, make-up, or nail polish. Do NOT wear lotions, powders, or perfumes.  You may wear deoderant. Do NOT shave for 48 hours prior to surgery. Do NOT bring valuables to the hospital. Contacts, dentures, or bridgework may not be worn into surgery. Leave suitcase in car.  After surgery it may be brought to your room.  For patients admitted to the hospital, checkout time is 11:00 AM the day of discharge.

## 2013-08-06 MED ORDER — CEFOTETAN DISODIUM 2 G IJ SOLR
2.0000 g | INTRAMUSCULAR | Status: AC
  Administered 2013-08-07: 2 g via INTRAVENOUS
  Filled 2013-08-06: qty 2

## 2013-08-07 ENCOUNTER — Ambulatory Visit (HOSPITAL_COMMUNITY): Payer: 59 | Admitting: Anesthesiology

## 2013-08-07 ENCOUNTER — Inpatient Hospital Stay (HOSPITAL_COMMUNITY)
Admission: RE | Admit: 2013-08-07 | Discharge: 2013-08-08 | DRG: 743 | Disposition: A | Discharge status: Home / Self Care | Payer: 59 | Source: Ambulatory Visit | Attending: Obstetrics and Gynecology | Admitting: Obstetrics and Gynecology

## 2013-08-07 ENCOUNTER — Encounter (HOSPITAL_COMMUNITY)
Admission: RE | Disposition: A | Discharge status: Home / Self Care | Payer: Self-pay | Source: Ambulatory Visit | Attending: Obstetrics and Gynecology

## 2013-08-07 ENCOUNTER — Encounter (HOSPITAL_COMMUNITY): Payer: 59 | Admitting: Anesthesiology

## 2013-08-07 ENCOUNTER — Encounter (HOSPITAL_COMMUNITY): Payer: Self-pay | Admitting: Anesthesiology

## 2013-08-07 DIAGNOSIS — N949 Unspecified condition associated with female genital organs and menstrual cycle: Principal | ICD-10-CM | POA: Diagnosis present

## 2013-08-07 DIAGNOSIS — N809 Endometriosis, unspecified: Secondary | ICD-10-CM

## 2013-08-07 DIAGNOSIS — N8000 Endometriosis of the uterus, unspecified: Secondary | ICD-10-CM | POA: Diagnosis present

## 2013-08-07 DIAGNOSIS — IMO0002 Reserved for concepts with insufficient information to code with codable children: Secondary | ICD-10-CM | POA: Diagnosis present

## 2013-08-07 DIAGNOSIS — N8003 Adenomyosis of the uterus: Secondary | ICD-10-CM | POA: Diagnosis present

## 2013-08-07 DIAGNOSIS — Z882 Allergy status to sulfonamides status: Secondary | ICD-10-CM

## 2013-08-07 DIAGNOSIS — N72 Inflammatory disease of cervix uteri: Secondary | ICD-10-CM | POA: Diagnosis present

## 2013-08-07 HISTORY — PX: LAPAROSCOPIC ASSISTED VAGINAL HYSTERECTOMY: SHX5398

## 2013-08-07 LAB — PREGNANCY, URINE: Preg Test, Ur: NEGATIVE

## 2013-08-07 SURGERY — HYSTERECTOMY, VAGINAL, LAPAROSCOPY-ASSISTED
Anesthesia: General

## 2013-08-07 MED ORDER — NALOXONE HCL 0.4 MG/ML IJ SOLN
0.4000 mg | INTRAMUSCULAR | Status: DC | PRN
Start: 2013-08-07 — End: 2013-08-08

## 2013-08-07 MED ORDER — BUPIVACAINE HCL (PF) 0.25 % IJ SOLN
INTRAMUSCULAR | Status: DC | PRN
  Administered 2013-08-07: 6 mL

## 2013-08-07 MED ORDER — SCOPOLAMINE 1 MG/3DAYS TD PT72
MEDICATED_PATCH | TRANSDERMAL | Status: AC
  Administered 2013-08-07: 1.5 mg via TRANSDERMAL
  Filled 2013-08-07: qty 1

## 2013-08-07 MED ORDER — PROPOFOL 10 MG/ML IV EMUL
INTRAVENOUS | Status: AC
  Filled 2013-08-07: qty 20

## 2013-08-07 MED ORDER — KETOROLAC TROMETHAMINE 30 MG/ML IJ SOLN
INTRAMUSCULAR | Status: AC
  Filled 2013-08-07: qty 1

## 2013-08-07 MED ORDER — LACTATED RINGERS IV SOLN
INTRAVENOUS | Status: DC
  Administered 2013-08-07 (×2): via INTRAVENOUS

## 2013-08-07 MED ORDER — NEOSTIGMINE METHYLSULFATE 1 MG/ML IJ SOLN
INTRAMUSCULAR | Status: DC | PRN
  Administered 2013-08-07: 3 mg via INTRAVENOUS

## 2013-08-07 MED ORDER — BUTORPHANOL TARTRATE 1 MG/ML IJ SOLN: 1.0000 mg | INTRAMUSCULAR | Status: DC | PRN

## 2013-08-07 MED ORDER — ROCURONIUM BROMIDE 100 MG/10ML IV SOLN
INTRAVENOUS | Status: AC
  Filled 2013-08-07: qty 1

## 2013-08-07 MED ORDER — KETOROLAC TROMETHAMINE 30 MG/ML IJ SOLN
30.0000 mg | Freq: Four times a day (QID) | INTRAMUSCULAR | Status: DC
  Administered 2013-08-07 – 2013-08-08 (×3): 30 mg via INTRAVENOUS
  Filled 2013-08-07 (×4): qty 1

## 2013-08-07 MED ORDER — MEPERIDINE HCL 25 MG/ML IJ SOLN: 6.2500 mg | INTRAMUSCULAR | Status: DC | PRN

## 2013-08-07 MED ORDER — LACTATED RINGERS IR SOLN
Status: DC | PRN
  Administered 2013-08-07: 3000 mL

## 2013-08-07 MED ORDER — FENTANYL CITRATE 0.05 MG/ML IJ SOLN
INTRAMUSCULAR | Status: DC | PRN
  Administered 2013-08-07 (×5): 50 ug via INTRAVENOUS

## 2013-08-07 MED ORDER — SCOPOLAMINE 1 MG/3DAYS TD PT72
1.0000 | MEDICATED_PATCH | TRANSDERMAL | Status: DC
  Administered 2013-08-07: 1.5 mg via TRANSDERMAL

## 2013-08-07 MED ORDER — OXYCODONE-ACETAMINOPHEN 5-325 MG PO TABS
1.0000 | ORAL_TABLET | ORAL | Status: DC | PRN
  Administered 2013-08-08: 1 via ORAL
  Filled 2013-08-07: qty 1

## 2013-08-07 MED ORDER — GLYCOPYRROLATE 0.2 MG/ML IJ SOLN
INTRAMUSCULAR | Status: AC
  Filled 2013-08-07: qty 2

## 2013-08-07 MED ORDER — ONDANSETRON HCL 4 MG/2ML IJ SOLN: 4.0000 mg | Freq: Four times a day (QID) | INTRAMUSCULAR | Status: DC | PRN

## 2013-08-07 MED ORDER — NEOSTIGMINE METHYLSULFATE 1 MG/ML IJ SOLN
INTRAMUSCULAR | Status: AC
  Filled 2013-08-07: qty 1

## 2013-08-07 MED ORDER — DIPHENHYDRAMINE HCL 50 MG/ML IJ SOLN
12.5000 mg | Freq: Four times a day (QID) | INTRAMUSCULAR | Status: DC | PRN
Start: 2013-08-07 — End: 2013-08-08

## 2013-08-07 MED ORDER — KETOROLAC TROMETHAMINE 30 MG/ML IJ SOLN: 30.0000 mg | Freq: Once | INTRAMUSCULAR | Status: DC

## 2013-08-07 MED ORDER — DEXTROSE IN LACTATED RINGERS 5 % IV SOLN
INTRAVENOUS | Status: DC
  Administered 2013-08-07 – 2013-08-08 (×3): via INTRAVENOUS

## 2013-08-07 MED ORDER — FENTANYL CITRATE 0.05 MG/ML IJ SOLN
INTRAMUSCULAR | Status: AC
  Filled 2013-08-07: qty 5

## 2013-08-07 MED ORDER — SODIUM CHLORIDE 0.9 % IJ SOLN: 9.0000 mL | INTRAMUSCULAR | Status: DC | PRN

## 2013-08-07 MED ORDER — SODIUM CHLORIDE 0.9 % IJ SOLN
INTRAMUSCULAR | Status: DC | PRN
  Administered 2013-08-07: 3 mL via INTRAVENOUS

## 2013-08-07 MED ORDER — PROPOFOL 10 MG/ML IV BOLUS: INTRAVENOUS | Status: DC | PRN

## 2013-08-07 MED ORDER — PROPOFOL 10 MG/ML IV BOLUS
INTRAVENOUS | Status: DC | PRN
  Administered 2013-08-07: 180 mg via INTRAVENOUS

## 2013-08-07 MED ORDER — KETOROLAC TROMETHAMINE 30 MG/ML IJ SOLN: 15.0000 mg | Freq: Once | INTRAMUSCULAR | Status: DC | PRN

## 2013-08-07 MED ORDER — HYDROMORPHONE HCL PF 1 MG/ML IJ SOLN
INTRAMUSCULAR | Status: AC
  Administered 2013-08-07: 0.5 mg via INTRAVENOUS
  Filled 2013-08-07: qty 1

## 2013-08-07 MED ORDER — GLYCOPYRROLATE 0.2 MG/ML IJ SOLN
INTRAMUSCULAR | Status: DC | PRN
  Administered 2013-08-07: 0.6 mg via INTRAVENOUS

## 2013-08-07 MED ORDER — MENTHOL 3 MG MT LOZG
1.0000 | LOZENGE | OROMUCOSAL | Status: DC | PRN
  Filled 2013-08-07: qty 9

## 2013-08-07 MED ORDER — ROCURONIUM BROMIDE 100 MG/10ML IV SOLN
INTRAVENOUS | Status: DC | PRN
  Administered 2013-08-07: 40 mg via INTRAVENOUS

## 2013-08-07 MED ORDER — MIDAZOLAM HCL 2 MG/2ML IJ SOLN
INTRAMUSCULAR | Status: AC
  Filled 2013-08-07: qty 2

## 2013-08-07 MED ORDER — KETOROLAC TROMETHAMINE 30 MG/ML IJ SOLN: 30.0000 mg | Freq: Four times a day (QID) | INTRAMUSCULAR | Status: DC

## 2013-08-07 MED ORDER — DEXAMETHASONE SODIUM PHOSPHATE 10 MG/ML IJ SOLN
INTRAMUSCULAR | Status: DC | PRN
  Administered 2013-08-07: 10 mg via INTRAVENOUS

## 2013-08-07 MED ORDER — ONDANSETRON HCL 4 MG/2ML IJ SOLN
INTRAMUSCULAR | Status: AC
  Filled 2013-08-07: qty 2

## 2013-08-07 MED ORDER — DIPHENHYDRAMINE HCL 12.5 MG/5ML PO ELIX
12.5000 mg | ORAL_SOLUTION | Freq: Four times a day (QID) | ORAL | Status: DC | PRN
  Administered 2013-08-08 (×2): 12.5 mg via ORAL
  Filled 2013-08-07 (×4): qty 5

## 2013-08-07 MED ORDER — LIDOCAINE HCL (CARDIAC) 20 MG/ML IV SOLN
INTRAVENOUS | Status: DC | PRN
  Administered 2013-08-07: 20 mg via INTRAVENOUS

## 2013-08-07 MED ORDER — DEXAMETHASONE SODIUM PHOSPHATE 10 MG/ML IJ SOLN
INTRAMUSCULAR | Status: AC
  Filled 2013-08-07: qty 1

## 2013-08-07 MED ORDER — LIDOCAINE HCL (CARDIAC) 20 MG/ML IV SOLN
INTRAVENOUS | Status: AC
  Filled 2013-08-07: qty 5

## 2013-08-07 MED ORDER — SODIUM CHLORIDE 0.9 % IJ SOLN
INTRAMUSCULAR | Status: AC
  Filled 2013-08-07: qty 10

## 2013-08-07 MED ORDER — MIDAZOLAM HCL 2 MG/2ML IJ SOLN
INTRAMUSCULAR | Status: DC | PRN
  Administered 2013-08-07: 2 mg via INTRAVENOUS

## 2013-08-07 MED ORDER — GLYCOPYRROLATE 0.2 MG/ML IJ SOLN
INTRAMUSCULAR | Status: AC
  Filled 2013-08-07: qty 3

## 2013-08-07 MED ORDER — BUPIVACAINE HCL (PF) 0.25 % IJ SOLN
INTRAMUSCULAR | Status: AC
  Filled 2013-08-07: qty 30

## 2013-08-07 MED ORDER — KETOROLAC TROMETHAMINE 30 MG/ML IJ SOLN
INTRAMUSCULAR | Status: DC | PRN
  Administered 2013-08-07: 30 mg via INTRAVENOUS

## 2013-08-07 MED ORDER — ONDANSETRON HCL 4 MG/2ML IJ SOLN
INTRAMUSCULAR | Status: DC | PRN
  Administered 2013-08-07: 4 mg via INTRAVENOUS

## 2013-08-07 MED ORDER — GLYCOPYRROLATE 0.2 MG/ML IJ SOLN
INTRAMUSCULAR | Status: AC
  Filled 2013-08-07: qty 1

## 2013-08-07 MED ORDER — HYDROMORPHONE HCL PF 1 MG/ML IJ SOLN
0.2500 mg | INTRAMUSCULAR | Status: DC | PRN
  Administered 2013-08-07 (×4): 0.5 mg via INTRAVENOUS

## 2013-08-07 MED ORDER — MORPHINE SULFATE (PF) 1 MG/ML IV SOLN
INTRAVENOUS | Status: DC
  Administered 2013-08-07: 3 mg via INTRAVENOUS
  Administered 2013-08-07: 3.999 mg via INTRAVENOUS
  Administered 2013-08-07: 11:00:00 via INTRAVENOUS
  Administered 2013-08-07 – 2013-08-08 (×2): 2 mg via INTRAVENOUS
  Administered 2013-08-08: 1 mg via INTRAVENOUS
  Filled 2013-08-07: qty 25

## 2013-08-07 MED ORDER — PROMETHAZINE HCL 25 MG/ML IJ SOLN: 6.2500 mg | INTRAMUSCULAR | Status: DC | PRN

## 2013-08-07 MED ORDER — IBUPROFEN 800 MG PO TABS
800.0000 mg | ORAL_TABLET | Freq: Three times a day (TID) | ORAL | Status: DC | PRN
  Administered 2013-08-08: 800 mg via ORAL
  Filled 2013-08-07: qty 1

## 2013-08-07 MED ORDER — ONDANSETRON HCL 4 MG PO TABS
4.0000 mg | ORAL_TABLET | Freq: Four times a day (QID) | ORAL | Status: DC | PRN
Start: 2013-08-07 — End: 2013-08-08

## 2013-08-07 SURGICAL SUPPLY — 37 items
ADH SKN CLS APL DERMABOND .7 (GAUZE/BANDAGES/DRESSINGS) ×2
ADH SKN CLS LQ APL DERMABOND (GAUZE/BANDAGES/DRESSINGS) ×1
CABLE HIGH FREQUENCY MONO STRZ (ELECTRODE) ×2 IMPLANT
CATH ROBINSON RED A/P 16FR (CATHETERS) ×2 IMPLANT
CLOTH BEACON ORANGE TIMEOUT ST (SAFETY) ×3 IMPLANT
CONT PATH 16OZ SNAP LID 3702 (MISCELLANEOUS) ×3 IMPLANT
COVER TABLE BACK 60X90 (DRAPES) ×3 IMPLANT
DECANTER SPIKE VIAL GLASS SM (MISCELLANEOUS) ×2 IMPLANT
DERMABOND ADHESIVE PROPEN (GAUZE/BANDAGES/DRESSINGS) ×2
DERMABOND ADVANCED (GAUZE/BANDAGES/DRESSINGS) ×4
DERMABOND ADVANCED .7 DNX12 (GAUZE/BANDAGES/DRESSINGS) ×2 IMPLANT
DERMABOND ADVANCED .7 DNX6 (GAUZE/BANDAGES/DRESSINGS) IMPLANT
ELECT LIGASURE SHORT 9 REUSE (ELECTRODE) ×3 IMPLANT
ELECT REM PT RETURN 9FT ADLT (ELECTROSURGICAL) ×3
ELECTRODE REM PT RTRN 9FT ADLT (ELECTROSURGICAL) ×1 IMPLANT
GLOVE BIO SURGEON STRL SZ7 (GLOVE) ×6 IMPLANT
GLOVE BIOGEL PI IND STRL 6.5 (GLOVE) ×1 IMPLANT
GLOVE BIOGEL PI INDICATOR 6.5 (GLOVE) ×2
GOWN STRL REUS W/TWL LRG LVL3 (GOWN DISPOSABLE) ×12 IMPLANT
NEEDLE INSUFFLATION 120MM (ENDOMECHANICALS) ×3 IMPLANT
NS IRRIG 1000ML POUR BTL (IV SOLUTION) ×3 IMPLANT
PACK LAVH (CUSTOM PROCEDURE TRAY) ×3 IMPLANT
PROTECTOR NERVE ULNAR (MISCELLANEOUS) ×3 IMPLANT
SEALER TISSUE G2 CVD JAW 45CM (ENDOMECHANICALS) ×3 IMPLANT
SET IRRIG TUBING LAPAROSCOPIC (IRRIGATION / IRRIGATOR) IMPLANT
SUT MON AB 2-0 CT1 36 (SUTURE) ×6 IMPLANT
SUT VIC AB 0 CT1 18XCR BRD8 (SUTURE) ×3 IMPLANT
SUT VIC AB 0 CT1 36 (SUTURE) ×3 IMPLANT
SUT VIC AB 0 CT1 8-18 (SUTURE) ×9
SUT VICRYL 0 TIES 12 18 (SUTURE) ×3 IMPLANT
SUT VICRYL 4-0 PS2 18IN ABS (SUTURE) ×3 IMPLANT
TOWEL OR 17X24 6PK STRL BLUE (TOWEL DISPOSABLE) ×6 IMPLANT
TRAY FOLEY CATH 14FR (SET/KITS/TRAYS/PACK) ×3 IMPLANT
TROCAR OPTI TIP 5M 100M (ENDOMECHANICALS) ×3 IMPLANT
TROCAR XCEL DIL TIP R 11M (ENDOMECHANICALS) ×3 IMPLANT
WARMER LAPAROSCOPE (MISCELLANEOUS) ×3 IMPLANT
WATER STERILE IRR 1000ML POUR (IV SOLUTION) ×3 IMPLANT

## 2013-08-07 NOTE — Progress Notes (Signed)
The patient was re-examined with no change in status 

## 2013-08-07 NOTE — Anesthesia Postprocedure Evaluation (Signed)
Anesthesia Post Note  Patient: [name]  Procedure(s) Performed: Procedure(s) (LRB): LAPAROSCOPIC ASSISTED VAGINAL HYSTERECTOMY (N/A)  Anesthesia type: General  Patient location: [facility]  Post pain: Pain level controlled  Post assessment: Post-op Vital signs reviewed  Last Vitals:  Filed Vitals:   08/07/13 1330  BP: 91/41  Pulse: 50  Temp: 36.6 C  Resp: 16    Post vital signs: Reviewed  Level of consciousness: sedated  Complications: No apparent anesthesia complications'

## 2013-08-07 NOTE — Addendum Note (Signed)
Addendum created 08/07/13 1356 by Algis GreenhouseLinda A Litha Lamartina, CRNA   Modules edited: Notes Section   Notes Section:  File: [id number]

## 2013-08-07 NOTE — Anesthesia Preprocedure Evaluation (Signed)
Anesthesia Evaluation  Patient identified by MRN, date of birth, ID band Patient awake    Reviewed: Allergy & Precautions, H&P , NPO status , Patient's Chart, lab work & pertinent test results, reviewed documented beta blocker date and time   History of Anesthesia Complications (+) PONV and history of anesthetic complications  Airway Mallampati: I TM Distance: >3 FB Neck ROM: full    Dental no notable dental hx. (+) Teeth Intact   Pulmonary    Pulmonary exam normal       Cardiovascular negative cardio ROS      Neuro/Psych negative psych ROS   GI/Hepatic Neg liver ROS,   Endo/Other  negative endocrine ROS  Renal/GU negative Renal ROS     Musculoskeletal   Abdominal Normal abdominal exam  (+)   Peds  Hematology   Anesthesia Other Findings   Reproductive/Obstetrics negative OB ROS                           Anesthesia Physical Anesthesia Plan  ASA: II  Anesthesia Plan: General   Post-op Pain Management:    Induction: Intravenous  Airway Management Planned: Oral ETT  Additional Equipment:   Intra-op Plan:   Post-operative Plan: Extubation in OR  Informed Consent: I have reviewed the patients History and Physical, chart, labs and discussed the procedure including the risks, benefits and alternatives for the proposed anesthesia with the patient or authorized representative who has indicated his/her understanding and acceptance.   Dental Advisory Given  Plan Discussed with: CRNA and Surgeon  Anesthesia Plan Comments:         Anesthesia Quick Evaluation

## 2013-08-07 NOTE — Transfer of Care (Signed)
Immediate Anesthesia Transfer of Care Note  Patient: [name]  Procedure(s) Performed: Procedure(s): LAPAROSCOPIC ASSISTED VAGINAL HYSTERECTOMY (N/A)  Patient Location: PACU  Anesthesia Type:General  Level of Consciousness: awake, oriented and patient cooperative  Airway & Oxygen Therapy: Patient Spontanous Breathing and Patient connected to nasal cannula oxygen  Post-op Assessment: Report given to PACU RN and Post -op Vital signs reviewed and stable  Post vital signs: Reviewed and stable  Complications: No apparent anesthesia complications

## 2013-08-07 NOTE — Anesthesia Postprocedure Evaluation (Signed)
  Anesthesia Post-op Note  Patient: [name]  Procedure(s) Performed: Procedure(s): LAPAROSCOPIC ASSISTED VAGINAL HYSTERECTOMY (N/A)  Patient Location: PACU  Anesthesia Type:General  Level of Consciousness: awake, alert  and oriented  Airway and Oxygen Therapy: Patient Spontanous Breathing  Post-op Pain: mild  Post-op Assessment: Post-op Vital signs reviewed, Patient's Cardiovascular Status Stable, Respiratory Function Stable, Patent Airway, No signs of Nausea or vomiting and Pain level controlled  Post-op Vital Signs: Reviewed and stable  Complications: No apparent anesthesia complications

## 2013-08-07 NOTE — Op Note (Signed)
Preoperative diagnosis: Pelvic pain, dyspareunia, possible adenomyosis  Postoperative diagnosis: Same  Procedure: LAVH  Surgeon: Marcelle OverlieHolland  Assistant: Rana SnareLowe  EBL: 200 cc  Specimens removed: Uterus, to pathology  Procedure and findings:  The patient taken the operating room after an adequate level of general anesthesia was obtained with the patient's legs in stirrups the abdomen perineum and vagina were prepped and draped in the usual fashion for LAVH. The bladder was drained. EUA carried out, uterus midposition, normal size, mobile, adnexa negative. Appropriate timeout taken at that point. Hulka tenaculum was positioned.  Attention directed to the abdomen where the subumbilical area was infiltrated with quarter percent Marcaine plain, a small incision was made in the varies needle was introduced without difficulty. Its intra-abdominal position was verified by pressure water testing. A 2-1/2 L pneumoperitoneum syncopated, lap scopic trocar and sleeve were then introduced without difficulty. There was no evidence of any bleeding or trauma. 3 finger breaths above the symphysis in the midline, a 5 mm trocar was inserted under direct visualization. Pelvic findings as follows  The uterus itself is upper limit normal size bilateral adnexa unremarkable the upper abdomen the cul-de-sac were free and clear using the in seal device the utero-ovarian pedicles were coagulated and divided down to and including the round ligament. These areas were hemostatic the vaginal portion the procedure started at that point.  The legs were extended, weighted speculum was positioned, cervical vaginal mucosa was incised, posterior culdotomy performed without difficulty. The bladder was advanced superiorly with sharp and blunt dissection until the anterior peritoneal reflection could be identified, this was entered sharply and a retractor then used to gently elevate the bladder out of the field. In sequential manner, staying  close to the uterus, the uterosacral ligament, cardinal ligament uterine vasculature pedicles and upper broad ligament pedicles were clamped divided and suture ligated with 0 Vicryl suture. The fundus of the uterus is in delivered posteriorly remaining pedicles were clamped and tied. The posterior vaginal cuff was enclose a running locked fashion from 3 to 9:00. Prior to closure, sponge needle instrument catheter reported as correct x2. The vaginal mucosa was then closed right to left with interrupted 2-0 Monocryl sutures. This was hemostatic. The bladder was drained the Foley catheter at this point, clear urine noted. Repeat laparoscopy was carried out, irrigation with the Nehzhat was accomplished, the operative sites were hemostatic, pressure was reduced hemostasis was maintained. Its was removed, gas allowed to escape, the umbilical defect closed with 4-0 Monocryl subcuticular suture and Dermabond on the lower. She received Toradol at that point, tolerated the procedure well went to recovery room in good condition.  Dictated with [facility]  Mays Paino M. [place]

## 2013-08-08 ENCOUNTER — Encounter (HOSPITAL_COMMUNITY): Payer: Self-pay | Admitting: Obstetrics and Gynecology

## 2013-08-08 DIAGNOSIS — N8003 Adenomyosis of the uterus: Secondary | ICD-10-CM | POA: Diagnosis present

## 2013-08-08 DIAGNOSIS — N809 Endometriosis, unspecified: Secondary | ICD-10-CM

## 2013-08-08 LAB — CBC
HEMATOCRIT: 28.8 % — AB (ref 36.0–46.0)
Hemoglobin: 9.5 g/dL — ABNORMAL LOW (ref 12.0–15.0)
MCH: 28.7 pg (ref 26.0–34.0)
MCHC: 33 g/dL (ref 30.0–36.0)
MCV: 87 fL (ref 78.0–100.0)
Platelets: 192 10*3/uL (ref 150–400)
RBC: 3.31 MIL/uL — ABNORMAL LOW (ref 3.87–5.11)
RDW: 13 % (ref 11.5–15.5)
WBC: 12.3 10*3/uL — ABNORMAL HIGH (ref 4.0–10.5)

## 2013-08-08 MED ORDER — OXYCODONE-ACETAMINOPHEN 5-325 MG PO TABS: 1.0000 | ORAL_TABLET | ORAL | Status: AC | PRN

## 2013-08-08 MED ORDER — IBUPROFEN 800 MG PO TABS: 800.0000 mg | ORAL_TABLET | Freq: Three times a day (TID) | ORAL | Status: AC | PRN

## 2013-08-08 NOTE — Progress Notes (Signed)
Pt discharged to home with husband.  Condition stable.  Pt ambulated to car with RN.  No equipment for home ordered at discharge. 

## 2013-08-08 NOTE — Discharge Summary (Signed)
Physician Discharge Summary  Patient ID: [name] MRN: [id number] DOB/AGE: Feb 03, 1971 43 y.o.  Admit date: 08/07/2013 Discharge date: 08/08/2013  Admission Diagnoses:pelvic pain,dysparunia,adenomyosis  Discharge Diagnoses: same Active Problems:   Adenomyosis   Discharged Condition: good  Hospital Course: adm for LAVH, DC POD #1>>>afeb, tol PO  Consults: None  Significant Diagnostic Studies: labs:  CBC    Component Value Date/Time   WBC 12.3* 08/08/2013 0540   RBC 3.31* 08/08/2013 0540   HGB 9.5* 08/08/2013 0540   HCT 28.8* 08/08/2013 0540   PLT 192 08/08/2013 0540   MCV 87.0 08/08/2013 0540   MCH 28.7 08/08/2013 0540   MCHC 33.0 08/08/2013 0540   RDW 13.0 08/08/2013 0540   LYMPHSABS 2.5 12/09/2009 1334   MONOABS 0.3 12/09/2009 1334   EOSABS 0.1 12/09/2009 1334   BASOSABS 0.0 12/09/2009 1334      Treatments: surgery: LAVH  Discharge Exam: Blood pressure 81/35, pulse 51, temperature 98.1 F (36.7 C), temperature source Oral, resp. rate 16, height 5\' 3"  (1.6 m), weight 131 lb (59.421 kg), SpO2 100.00%. Incision/Wound:abd soft + BS, INCS C/D  Disposition: 01-Home or Self Care     Medication List    STOP taking these medications       acetaminophen 500 MG tablet  Commonly known as:  TYLENOL     VISINE 0.05 % ophthalmic solution  Generic drug:  tetrahydrozoline      TAKE these medications       ibuprofen 800 MG tablet  Commonly known as:  ADVIL,MOTRIN  Take 1 tablet (800 mg total) by mouth every 8 (eight) hours as needed for moderate pain (mild pain).     oxyCODONE-acetaminophen 5-325 MG per tablet  Commonly known as:  PERCOCET/ROXICET  Take 1-2 tablets by mouth every 4 (four) hours as needed for severe pain (moderate to severe pain (when tolerating fluids)).           Follow-up Information   Follow up with Meriel PicaHOLLAND,Berdine Rasmusson M, MD. Schedule an appointment as soon as possible for a visit in 1 week.   Specialty:  Obstetrics and Gynecology   Contact information:   [address] [place] [id number] [phone]       Signed: Meriel PicaHOLLAND,Dajion Bickford M 08/08/2013, 9:03 AM

## 2013-08-14 MED ORDER — CEFAZOLIN SODIUM-DEXTROSE 2-3 GM-% IV SOLR
INTRAVENOUS | Status: AC
  Filled 2013-08-14: qty 50

## 2014-02-16 ENCOUNTER — Other Ambulatory Visit: Payer: Self-pay

## 2015-02-05 ENCOUNTER — Other Ambulatory Visit: Payer: Self-pay | Admitting: Obstetrics and Gynecology

## 2015-02-06 LAB — CYTOLOGY - PAP

## 2015-05-19 ENCOUNTER — Emergency Department (HOSPITAL_BASED_OUTPATIENT_CLINIC_OR_DEPARTMENT_OTHER): Payer: 59

## 2015-05-19 ENCOUNTER — Emergency Department (HOSPITAL_BASED_OUTPATIENT_CLINIC_OR_DEPARTMENT_OTHER)
Admission: EM | Admit: 2015-05-19 | Discharge: 2015-05-19 | Disposition: A | Discharge status: Home / Self Care | Payer: 59 | Attending: Emergency Medicine | Admitting: Emergency Medicine

## 2015-05-19 ENCOUNTER — Encounter (HOSPITAL_BASED_OUTPATIENT_CLINIC_OR_DEPARTMENT_OTHER): Payer: Self-pay | Admitting: Emergency Medicine

## 2015-05-19 DIAGNOSIS — K219 Gastro-esophageal reflux disease without esophagitis: Secondary | ICD-10-CM | POA: Insufficient documentation

## 2015-05-19 DIAGNOSIS — R079 Chest pain, unspecified: Secondary | ICD-10-CM | POA: Diagnosis present

## 2015-05-19 DIAGNOSIS — Z8679 Personal history of other diseases of the circulatory system: Secondary | ICD-10-CM | POA: Diagnosis not present

## 2015-05-19 DIAGNOSIS — R0602 Shortness of breath: Secondary | ICD-10-CM | POA: Insufficient documentation

## 2015-05-19 DIAGNOSIS — Z862 Personal history of diseases of the blood and blood-forming organs and certain disorders involving the immune mechanism: Secondary | ICD-10-CM | POA: Diagnosis not present

## 2015-05-19 DIAGNOSIS — M546 Pain in thoracic spine: Secondary | ICD-10-CM | POA: Insufficient documentation

## 2015-05-19 DIAGNOSIS — R0789 Other chest pain: Secondary | ICD-10-CM | POA: Diagnosis not present

## 2015-05-19 LAB — TROPONIN I: Troponin I: <0.03 ng/mL (ref <0.031)

## 2015-05-19 LAB — D-DIMER, QUANTITATIVE: D-Dimer, Quant: 0.57 ug/mL-FEU — ABNORMAL HIGH (ref 0.00–0.50)

## 2015-05-19 LAB — HEPATIC FUNCTION PANEL
ALK PHOS: 47 U/L (ref 38–126)
ALT: 16 U/L (ref 14–54)
AST: 18 U/L (ref 15–41)
Albumin: 4 g/dL (ref 3.5–5.0)
Total Bilirubin: 0.3 mg/dL (ref 0.3–1.2)
Total Protein: 7.2 g/dL (ref 6.5–8.1)

## 2015-05-19 LAB — LIPASE, BLOOD: Lipase: 35 U/L (ref 11–51)

## 2015-05-19 LAB — CBC
HEMATOCRIT: 37.3 % (ref 36.0–46.0)
Hemoglobin: 12.3 g/dL (ref 12.0–15.0)
MCH: 28.2 pg (ref 26.0–34.0)
MCHC: 33 g/dL (ref 30.0–36.0)
MCV: 85.6 fL (ref 78.0–100.0)
Platelets: 309 10*3/uL (ref 150–400)
RBC: 4.36 MIL/uL (ref 3.87–5.11)
RDW: 13.1 % (ref 11.5–15.5)
WBC: 7.9 10*3/uL (ref 4.0–10.5)

## 2015-05-19 LAB — BASIC METABOLIC PANEL
ANION GAP: 6 (ref 5–15)
BUN: 17 mg/dL (ref 6–20)
CALCIUM: 9.3 mg/dL (ref 8.9–10.3)
CO2: 29 mmol/L (ref 22–32)
Chloride: 105 mmol/L (ref 101–111)
Creatinine, Ser: 0.85 mg/dL (ref 0.44–1.00)
GFR calc Af Amer: >60 mL/min (ref >60)
GFR calc non Af Amer: >60 mL/min (ref >60)
GLUCOSE: 92 mg/dL (ref 65–99)
POTASSIUM: 3.5 mmol/L (ref 3.5–5.1)
Sodium: 140 mmol/L (ref 135–145)

## 2015-05-19 MED ORDER — OMEPRAZOLE 20 MG PO CPDR: 20.0000 mg | DELAYED_RELEASE_CAPSULE | Freq: Every day | ORAL | Status: AC

## 2015-05-19 MED ORDER — IOHEXOL 350 MG/ML SOLN
100.0000 mL | Freq: Once | INTRAVENOUS | Status: AC | PRN
Start: 2015-05-19 — End: 2015-05-19
  Administered 2015-05-19: 100 mL via INTRAVENOUS

## 2015-05-19 NOTE — Discharge Instructions (Signed)
Nonspecific Chest Pain  There is no evidence of heart attack or blood clot in the lung. Take the stomach medicine as prescribed. Follow-up with her doctor for a stress test. Return to the ED if you develop new or worsening symptoms. Chest pain can be caused by many different conditions. There is always a chance that your pain could be related to something serious, such as a heart attack or a blood clot in your lungs. Chest pain can also be caused by conditions that are not life-threatening. If you have chest pain, it is very important to follow up with your health care provider. CAUSES  Chest pain can be caused by:  Heartburn.  Pneumonia or bronchitis.  Anxiety or stress.  Inflammation around your heart (pericarditis) or lung (pleuritis or pleurisy).  A blood clot in your lung.  A collapsed lung (pneumothorax). It can develop suddenly on its own (spontaneous pneumothorax) or from trauma to the chest.  Shingles infection (varicella-zoster virus).  Heart attack.  Damage to the bones, muscles, and cartilage that make up your chest wall. This can include:  Bruised bones due to injury.  Strained muscles or cartilage due to frequent or repeated coughing or overwork.  Fracture to one or more ribs.  Sore cartilage due to inflammation (costochondritis). RISK FACTORS  Risk factors for chest pain may include:  Activities that increase your risk for trauma or injury to your chest.  Respiratory infections or conditions that cause frequent coughing.  Medical conditions or overeating that can cause heartburn.  Heart disease or family history of heart disease.  Conditions or health behaviors that increase your risk of developing a blood clot.  Having had chicken pox (varicella zoster). SIGNS AND SYMPTOMS Chest pain can feel like:  Burning or tingling on the surface of your chest or deep in your chest.  Crushing, pressure, aching, or squeezing pain.  Dull or sharp pain that is  worse when you move, cough, or take a deep breath.  Pain that is also felt in your back, neck, shoulder, or arm, or pain that spreads to any of these areas. Your chest pain may come and go, or it may stay constant. DIAGNOSIS Lab tests or other studies may be needed to find the cause of your pain. Your health care provider may have you take a test called an ambulatory ECG (electrocardiogram). An ECG records your heartbeat patterns at the time the test is performed. You may also have other tests, such as:  Transthoracic echocardiogram (TTE). During echocardiography, sound waves are used to create a picture of all of the heart structures and to look at how blood flows through your heart.  Transesophageal echocardiogram (TEE).This is a more advanced imaging test that obtains images from inside your body. It allows your health care provider to see your heart in finer detail.  Cardiac monitoring. This allows your health care provider to monitor your heart rate and rhythm in real time.  Holter monitor. This is a portable device that records your heartbeat and can help to diagnose abnormal heartbeats. It allows your health care provider to track your heart activity for several days, if needed.  Stress tests. These can be done through exercise or by taking medicine that makes your heart beat more quickly.  Blood tests.  Imaging tests. TREATMENT  Your treatment depends on what is causing your chest pain. Treatment may include:  Medicines. These may include:  Acid blockers for heartburn.  Anti-inflammatory medicine.  Pain medicine for inflammatory conditions.  Antibiotic medicine, if an infection is present.  Medicines to dissolve blood clots.  Medicines to treat coronary artery disease.  Supportive care for conditions that do not require medicines. This may include:  Resting.  Applying heat or cold packs to injured areas.  Limiting activities until pain decreases. HOME CARE  INSTRUCTIONS  If you were prescribed an antibiotic medicine, finish it all even if you start to feel better.  Avoid any activities that bring on chest pain.  Do not use any tobacco products, including cigarettes, chewing tobacco, or electronic cigarettes. If you need help quitting, ask your health care provider.  Do not drink alcohol.  Take medicines only as directed by your health care provider.  Keep all follow-up visits as directed by your health care provider. This is important. This includes any further testing if your chest pain does not go away.  If heartburn is the cause for your chest pain, you may be told to keep your head raised (elevated) while sleeping. This reduces the chance that acid will go from your stomach into your esophagus.  Make lifestyle changes as directed by your health care provider. These may include:  Getting regular exercise. Ask your health care provider to suggest some activities that are safe for you.  Eating a heart-healthy diet. A registered dietitian can help you to learn healthy eating options.  Maintaining a healthy weight.  Managing diabetes, if necessary.  Reducing stress. SEEK MEDICAL CARE IF:  Your chest pain does not go away after treatment.  You have a rash with blisters on your chest.  You have a fever. SEEK IMMEDIATE MEDICAL CARE IF:   Your chest pain is worse.  You have an increasing cough, or you cough up blood.  You have severe abdominal pain.  You have severe weakness.  You faint.  You have chills.  You have sudden, unexplained chest discomfort.  You have sudden, unexplained discomfort in your arms, back, neck, or jaw.  You have shortness of breath at any time.  You suddenly start to sweat, or your skin gets clammy.  You feel nauseous or you vomit.  You suddenly feel light-headed or dizzy.  Your heart begins to beat quickly, or it feels like it is skipping beats. These symptoms may represent a serious  problem that is an emergency. Do not wait to see if the symptoms will go away. Get medical help right away. Call your local emergency services (911 in the [place] Do not drive yourself to the hospital.   This information is not intended to replace advice given to you by your health care provider. Make sure you discuss any questions you have with your health care provider.   Document Released: 01/28/2005 Document Revised: 05/11/2014 Document Reviewed: 11/24/2013 Elsevier Interactive Patient Education [employer]

## 2015-05-19 NOTE — ED Notes (Signed)
Pt placed on cont cardiac monitor with int NBP and cont POX

## 2015-05-19 NOTE — ED Notes (Signed)
EDP informed of D-dimer results

## 2015-05-19 NOTE — ED Provider Notes (Signed)
CSN: [id number]     Arrival date & time 05/19/15  1823 History  By signing my name below, I, Placido Sou, attest that this documentation has been prepared under the direction and in the presence of Glynn Octave, MD. Electronically Signed: Placido Sou, ED Scribe. 05/19/2015. 7:15 PM.    Chief Complaint  Patient presents with  . Chest Pain   The history is provided by the patient. No language interpreter was used.    HPI Comments: [name] is a 45 y.o. female who presents to the Emergency Department complaining of constant, mild, left CP with onset 1 day ago. She notes the pain began in her back and then spread over her left shoulder and beneath her left breast and describes the pain as a "burning". She denies any alleviating or exacerbating factors.  Pt notes associated, mild, SOB. Pt notes a hx of heart murmurs and GERD and denies her current pain feels similar to her GERD. She denies taking any medications for GERD.  Pt has never had a stress test performed. She denies a hx of smoking or a PMHx of DM. Pt denies a FHx including MI but notes her father has CHF. Pt notes a SHx including hysterectomy and cholecystectomy. She denies diaphoresis, LOC,  n/v, abd pain or any other associated symptoms at this time.    Past Medical History  Diagnosis Date  . Heart murmur     age 68-40  . Shortness of breath     with palpitation last a few seconds  . Dysrhythmia     palpitations lasts a few seconds  . GERD (gastroesophageal reflux disease)     history of  . Headache(784.0)     history of migraines  . Anemia     history of  . PONV (postoperative nausea and vomiting)     sensitive to anesthesia, hard to wake up after surgery   Past Surgical History  Procedure Laterality Date  . Cholecystectomy    . Tubal ligation    . Bartholin gland cyst excision    . Uterine ablation    . Sphincterotomy Bilateral   . Colonoscopy    . Upper gi endoscopy    . Wisdom tooth extraction     . Laparoscopic assisted vaginal hysterectomy N/A 08/07/2013    Procedure: LAPAROSCOPIC ASSISTED VAGINAL HYSTERECTOMY;  Surgeon: Meriel Pica, MD;  Location: [facility];  Service: Gynecology;  Laterality: N/A;   History reviewed. No pertinent family history. Social History  Substance Use Topics  . Smoking status: Never Smoker   . Smokeless tobacco: Never Used  . Alcohol Use: Yes     Comment: social   OB History    No data available     Review of Systems A complete 10 system review of systems was obtained and all systems are negative except as noted in the HPI and PMH.   Allergies  Sulfonamide derivatives and Sulfa antibiotics  Home Medications   Prior to Admission medications   Medication Sig Start Date End Date Taking? Authorizing Provider  ibuprofen (ADVIL,MOTRIN) 800 MG tablet Take 1 tablet (800 mg total) by mouth every 8 (eight) hours as needed for moderate pain (mild pain). 08/08/13   Richarda Overlie, MD  omeprazole (PRILOSEC) 20 MG capsule Take 1 capsule (20 mg total) by mouth daily. 05/19/15   Glynn Octave, MD  oxyCODONE-acetaminophen (PERCOCET/ROXICET) 5-325 MG per tablet Take 1-2 tablets by mouth every 4 (four) hours as needed for severe pain (moderate to severe pain (  when tolerating fluids)). 08/08/13   Richarda Overlieichard Holland, MD   BP 102/65 mmHg  Pulse 57  Temp(Src) 98.5 F (36.9 C) (Oral)  Resp 16  Ht 5\' 3"  (1.6 m)  Wt 131 lb (59.421 kg)  BMI 23.21 kg/m2  SpO2 97%  LMP 07/27/2013    Physical Exam  Constitutional: She is oriented to person, place, and time. She appears well-developed and well-nourished. No distress.  HENT:  Head: Normocephalic and atraumatic.  Mouth/Throat: Oropharynx is clear and moist. No oropharyngeal exudate.  Eyes: Conjunctivae and EOM are normal. Pupils are equal, round, and reactive to light.  Neck: Normal range of motion. Neck supple.  No meningismus.  Cardiovascular: Normal rate, regular rhythm, normal heart sounds and intact distal pulses.    No murmur heard. Pulmonary/Chest: Effort normal and breath sounds normal. No respiratory distress. She exhibits no tenderness.  Abdominal: Soft. There is no tenderness. There is no rebound and no guarding.  Musculoskeletal: Normal range of motion. She exhibits no edema or tenderness.  Neurological: She is alert and oriented to person, place, and time. No cranial nerve deficit. She exhibits normal muscle tone. Coordination normal.  No ataxia on finger to nose bilaterally. No pronator drift. 5/5 strength throughout. CN 2-12 intact.Equal grip strength. Sensation intact.   Skin: Skin is warm. No rash noted.  Psychiatric: She has a normal mood and affect. Her behavior is normal.  Nursing note and vitals reviewed.   ED Course  Procedures  DIAGNOSTIC STUDIES: Oxygen Saturation is 100% on RA, normal by my interpretation.    COORDINATION OF CARE: 7:05 PM Discussed next steps with pt and she agreed to the plan.   Labs Review Labs Reviewed  HEPATIC FUNCTION PANEL - Abnormal; Notable for the following:    Bilirubin, Direct <0.1 (*)    All other components within normal limits  D-DIMER, QUANTITATIVE (NOT AT [facility]) - Abnormal; Notable for the following:    D-Dimer, Quant 0.57 (*)    All other components within normal limits  BASIC METABOLIC PANEL  CBC  TROPONIN I  LIPASE, BLOOD  TROPONIN I    Imaging Review Dg Chest 2 View  05/19/2015  CLINICAL DATA:  45 year old with interscapular pain, headache and neck stiffness since yesterday. History of heart murmur. EXAM: CHEST  2 VIEW COMPARISON:  None. FINDINGS: The heart size and mediastinal contours are normal. The lungs are clear. There is no pleural effusion or pneumothorax. No acute osseous findings are identified. IMPRESSION: No active cardiopulmonary process. Electronically Signed   By: Carey BullocksWilliam  Veazey M.D.   On: 05/19/2015 19:24   Ct Angio Chest Pe W/cm &/or Wo Cm  05/19/2015  CLINICAL DATA:  Acute onset of minimal shortness of breath.  Left scapular and left breast pain. Elevated D-dimer. Initial encounter. EXAM: CT ANGIOGRAPHY CHEST WITH CONTRAST TECHNIQUE: Multidetector CT imaging of the chest was performed using the standard protocol during bolus administration of intravenous contrast. Multiplanar CT image reconstructions and MIPs were obtained to evaluate the vascular anatomy. CONTRAST:  100mL OMNIPAQUE IOHEXOL 350 MG/ML SOLN COMPARISON:  Chest radiograph performed earlier today at 7:04 p.m. FINDINGS: There is no evidence of pulmonary embolus. The lungs are clear bilaterally. There is no evidence of significant focal consolidation, pleural effusion or pneumothorax. No masses are identified; no abnormal focal contrast enhancement is seen. The mediastinum is unremarkable in appearance. No mediastinal lymphadenopathy is seen. No pericardial effusion is identified. The great vessels are grossly unremarkable. No axillary lymphadenopathy is seen. The visualized portions of the thyroid gland  are unremarkable in appearance. The visualized portions of the liver and spleen are unremarkable. The visualized portions of the pancreas, adrenal glands and kidneys are unremarkable. The patient is status post cholecystectomy, with clips noted at the gallbladder fossa. No acute osseous abnormalities are seen. Review of the MIP images confirms the above findings. IMPRESSION: No evidence of pulmonary embolus.  Lungs clear bilaterally. Electronically Signed   By: Roanna Raider M.D.   On: 05/19/2015 20:35   I have personally reviewed and evaluated these images and lab results as part of my medical decision-making.   EKG Interpretation   Date/Time:  Sunday May 19 2015 18:33:59 EST Ventricular Rate:  62 PR Interval:  118 QRS Duration: 90 QT Interval:  414 QTC Calculation: 420 R Axis:   70 Text Interpretation:  Normal sinus rhythm Normal ECG No previous ECGs  available Confirmed by Curstin Schmale  MD, Jahlisa Rossitto [phone]) on 05/19/2015 6:44:18  PM      MDM    Final diagnoses:  Atypical chest pain    left chest and upper back pain that have been constant since yesterday burning sensation worse today. Nothing makes it better or worse. No shortness of breath, nausea, vomiting or syncope.  EKG is normal sinus rhythm. No acute ST changes. Chest x-ray negative.  Pain is not reproducible. There is some concern for possible early zoster though no rash is yet visible. CXR negative.  D-dimer positive.  No PE on CT.  Troponin negative x 2.  Ongoing pain since yesterday. Low suspicion for ACS. Add PPI for possible GI etiology.  Monitor for development of possible zoster rash. followup with PCP for stress test. Return precautions discussed.   I personally performed the services described in this documentation, which was scribed in my presence. The recorded information has been reviewed and is accurate.   Glynn Octave, MD 05/20/15 [phone]

## 2015-05-19 NOTE — ED Notes (Addendum)
Patient states that she started to have chest burning and pain to her mid sternal area, with pain to her back. PAtient states that it has been intermittent since last night. Worse today. Patient also reports that she has been under an incredible about of stress lately

## 2015-05-19 NOTE — ED Notes (Signed)
Pt states yesterday was sitting in living room and began having pain between shoulder blades and radiated to breast area and also to left shoulder. Today having some HA and neck stiffness. States has been under a lot of stress.

## 2015-05-19 NOTE — ED Notes (Signed)
Pt alert, NAD, calm, interactive, resps e/u, speaking in clear complete sentences, no dyspnea noted, reports no change, admits to some continued chest discomfort, (denies: any other sx), [facility] NSR on monitor. VSS.

## 2015-05-19 NOTE — ED Notes (Signed)
MD at bedside. 

## 2015-05-19 NOTE — ED Notes (Signed)
Back from CT, no changes.  

## 2015-06-03 ENCOUNTER — Other Ambulatory Visit: Payer: Self-pay

## 2015-06-03 DIAGNOSIS — Z1231 Encounter for screening mammogram for malignant neoplasm of breast: Secondary | ICD-10-CM

## 2015-06-05 ENCOUNTER — Ambulatory Visit
Admission: RE | Admit: 2015-06-05 | Discharge: 2015-06-05 | Disposition: A | Discharge status: Home / Self Care | Payer: 59 | Source: Ambulatory Visit

## 2015-06-05 DIAGNOSIS — Z1231 Encounter for screening mammogram for malignant neoplasm of breast: Secondary | ICD-10-CM

## 2016-09-29 IMAGING — DX DG CHEST 2V
2 series · 2 of 2 positions shown · non-contrast
Comparison: None.

CLINICAL DATA: 45-year-old with interscapular pain, headache and
neck stiffness since yesterday. History of heart murmur.

EXAM:
CHEST  2 VIEW

[chest pa]
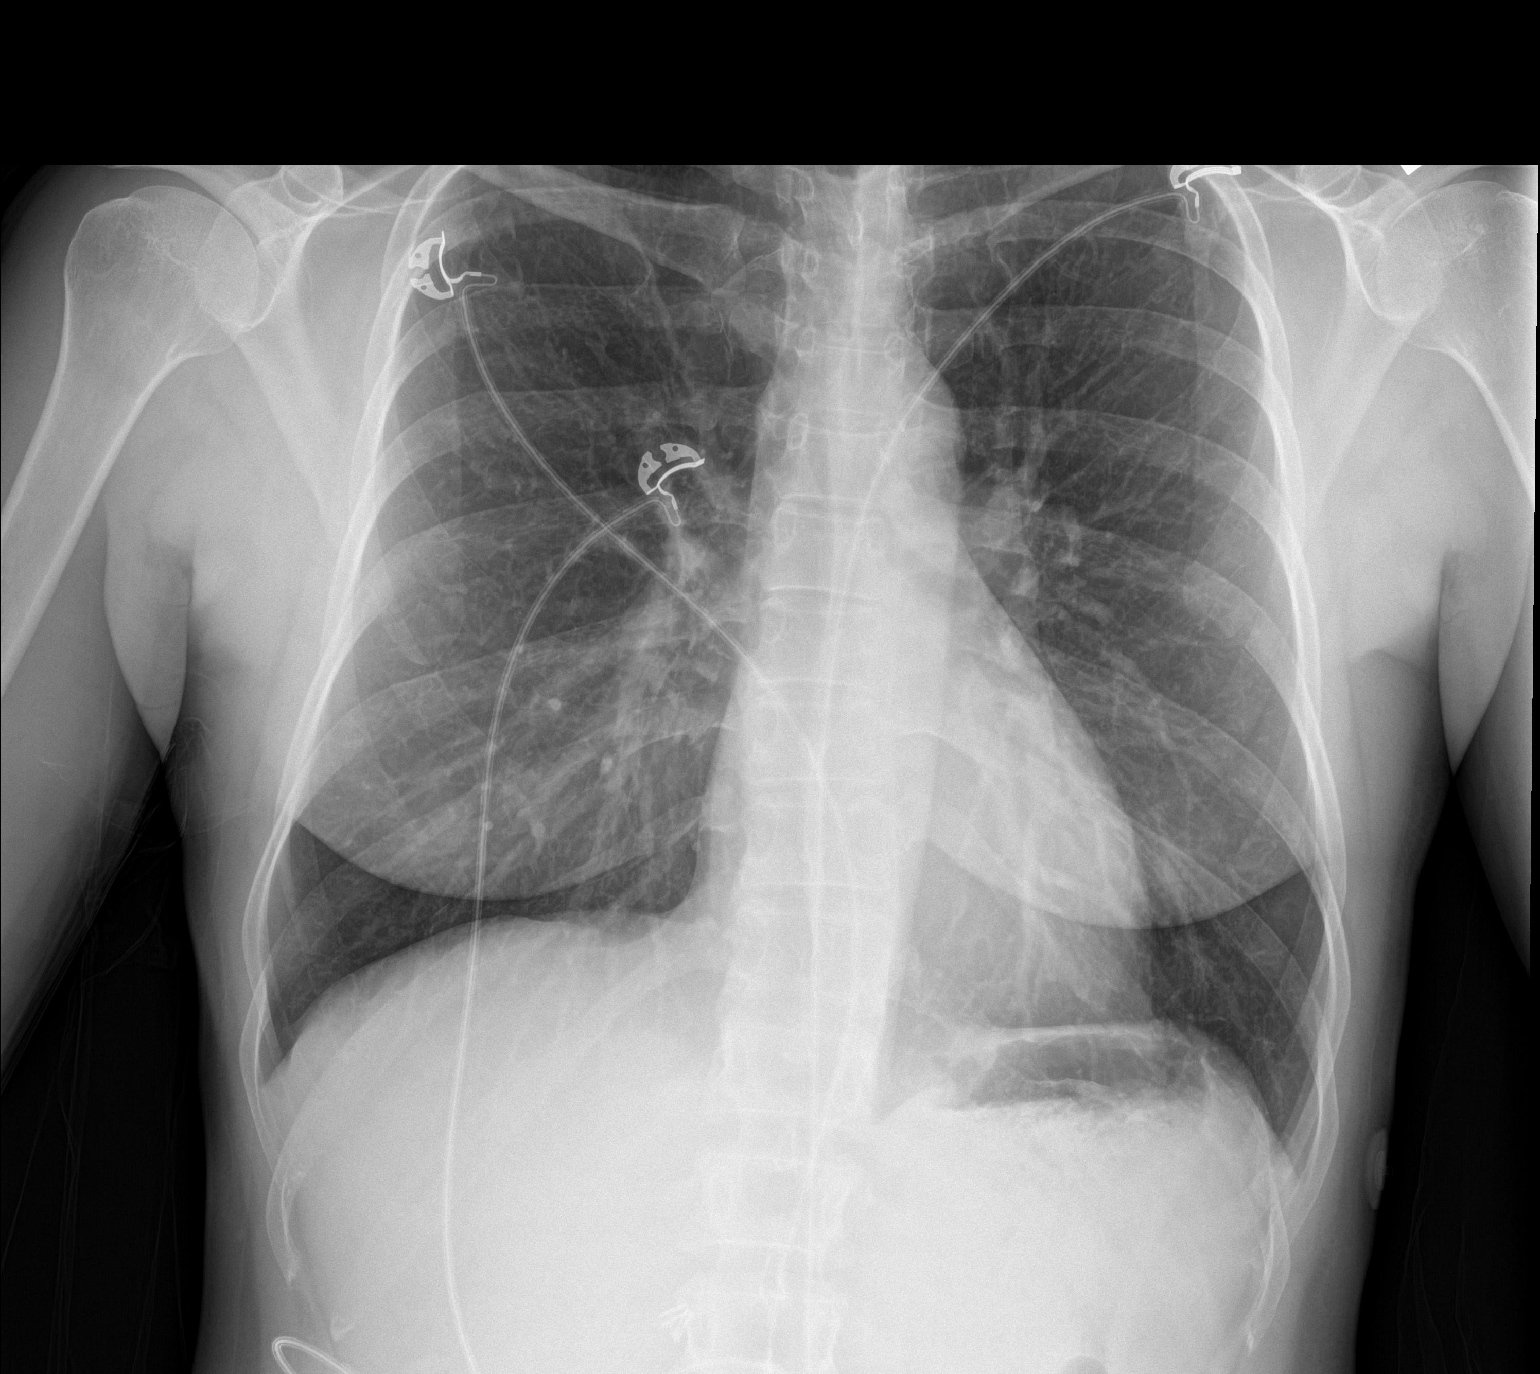

[chest lat]
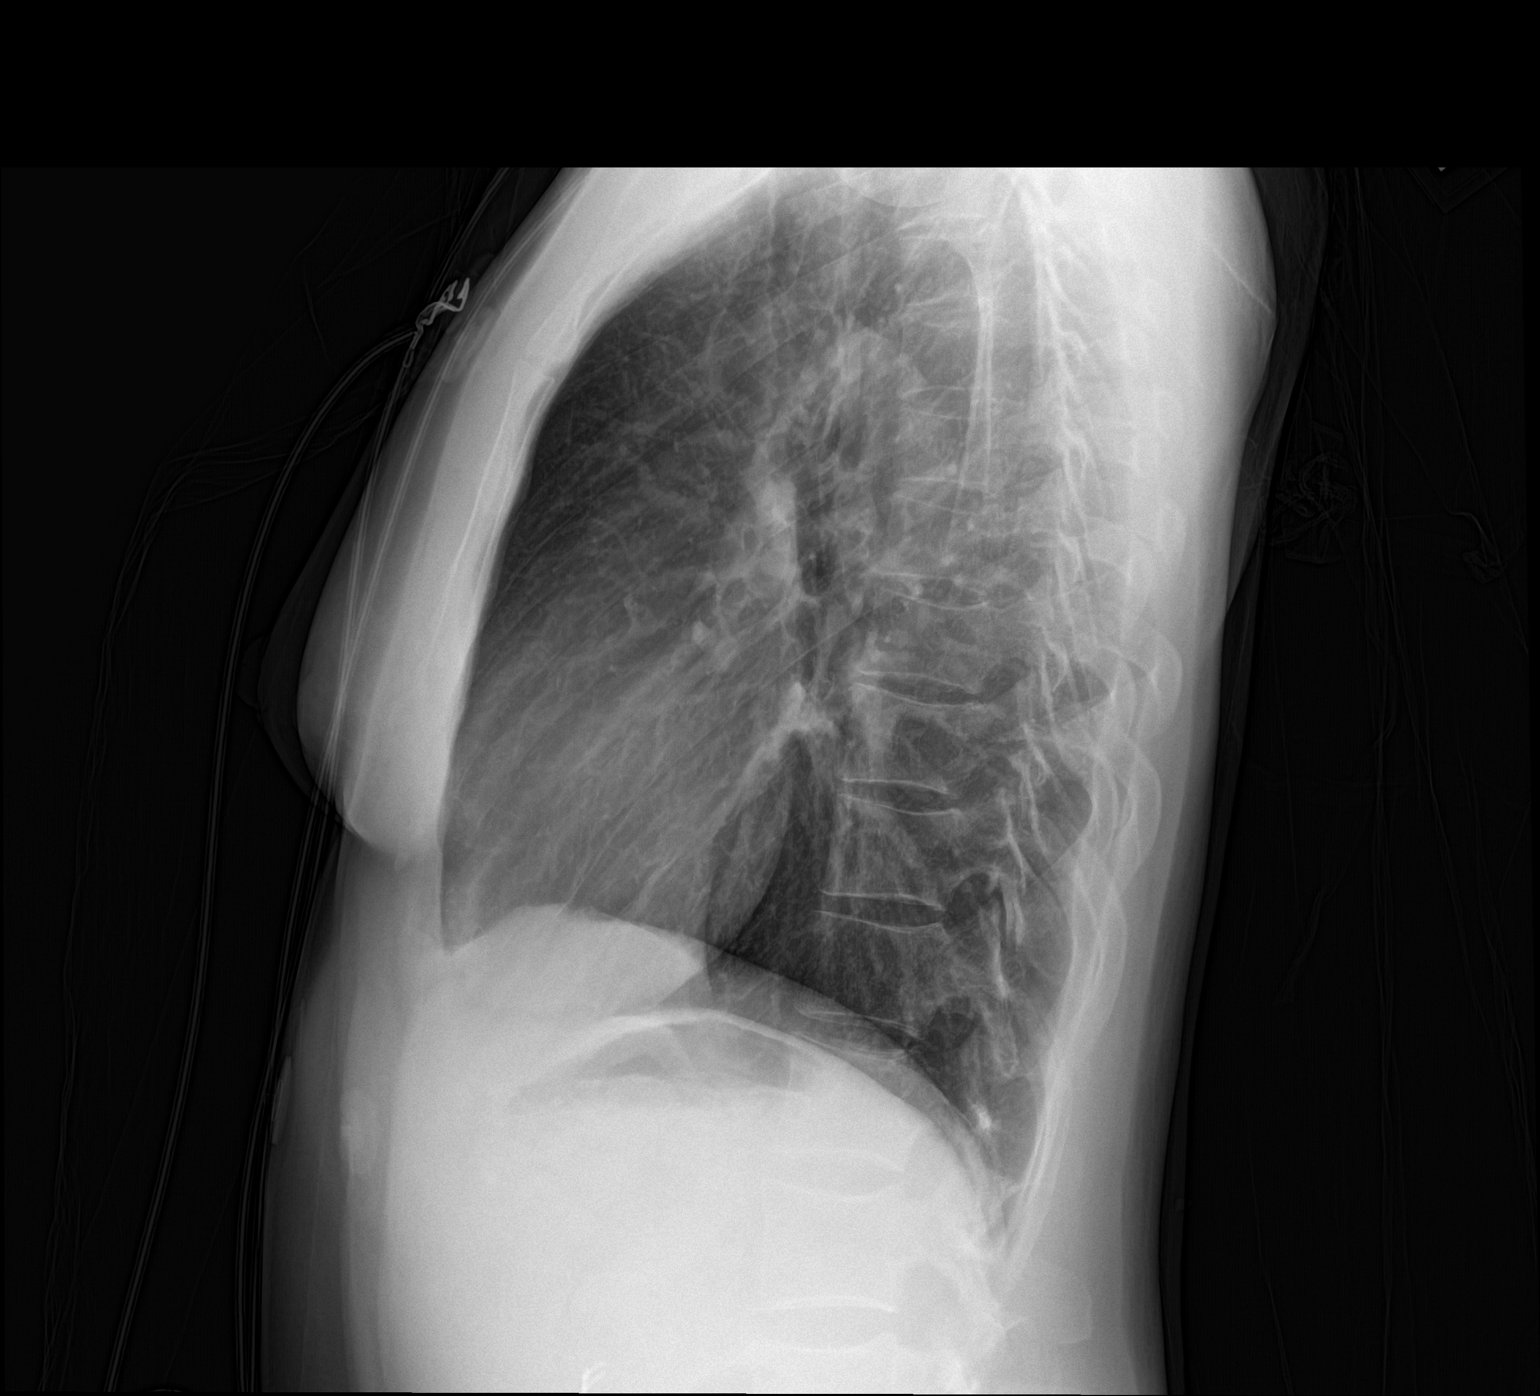

[2 of 2 positions shown; findings below may reference images not displayed]

FINDINGS: The heart size and mediastinal contours are normal. The lungs are
clear. There is no pleural effusion or pneumothorax. No acute
osseous findings are identified.
IMPRESSION: No active cardiopulmonary process.

## 2016-09-29 IMAGING — CT CT ANGIO CHEST
2 of 6 series · 19 of 46 positions shown · IV contrast (omnipaque)
Comparison: Chest radiograph performed earlier today at [DATE] p.m.

CLINICAL DATA: Acute onset of minimal shortness of breath. Left
scapular and left breast pain. Elevated D-dimer. Initial encounter.

EXAM:
CT ANGIOGRAPHY CHEST WITH CONTRAST
TECHNIQUE: Multidetector CT imaging of the chest was performed using the
standard protocol during bolus administration of intravenous
contrast. Multiplanar CT image reconstructions and MIPs were
obtained to evaluate the vascular anatomy.
CONTRAST:  100mL OMNIPAQUE IOHEXOL 350 MG/ML SOLN

[Series 8: thins · axial · 0.59mm/px · z∈[-318,-45]mm · 16 of 301 slices shown]
[im 14/301  lung]
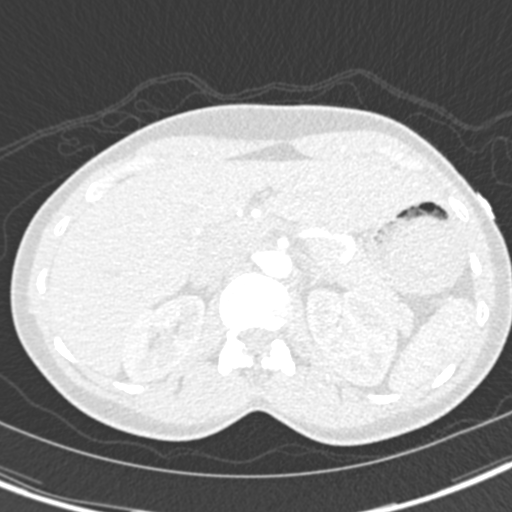
[im 40/301  soft-tissue]
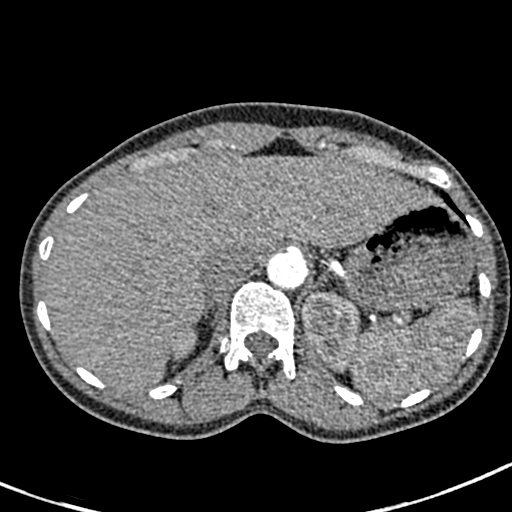
[im 53/301  lung]
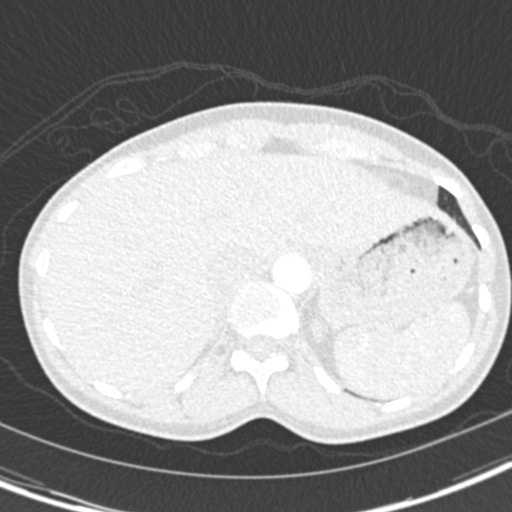
[im 66/301  soft-tissue]
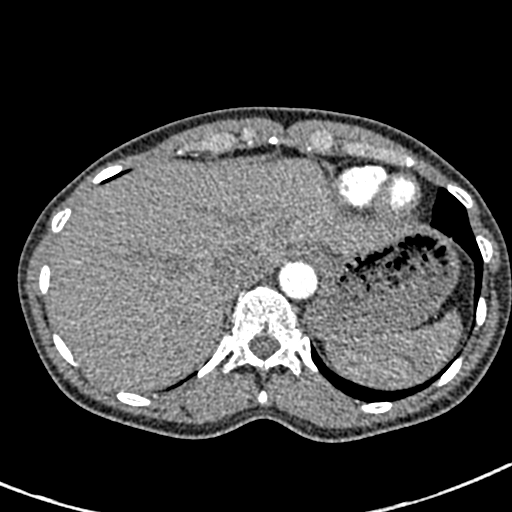
[im 92/301  lung]
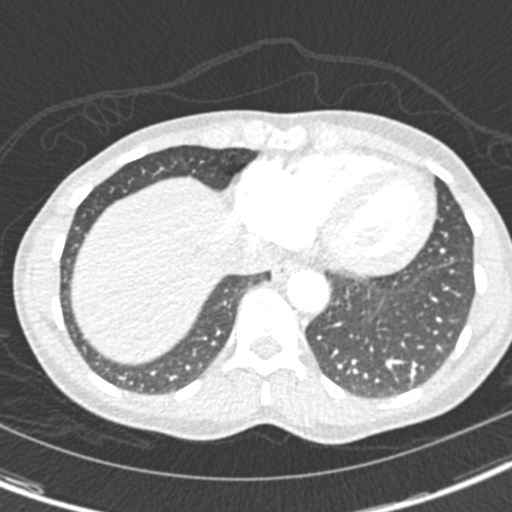
[im 105/301  soft-tissue]
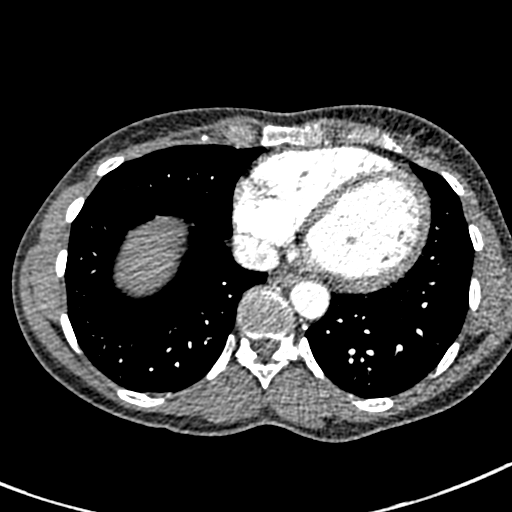
[im 118/301  lung]
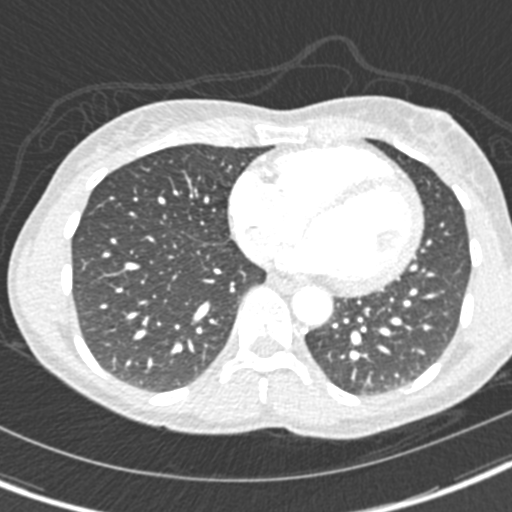
[im 144/301  soft-tissue]
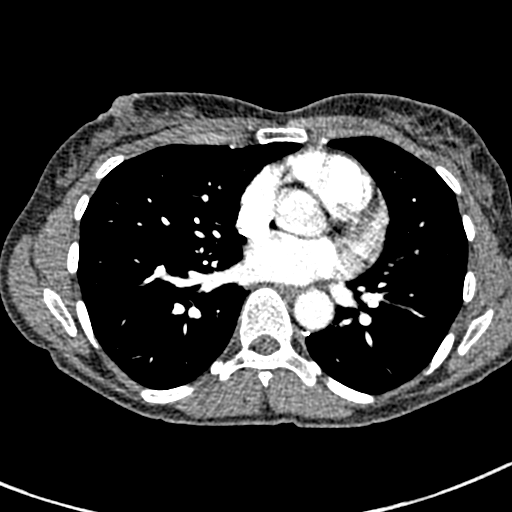
[im 157/301  lung]
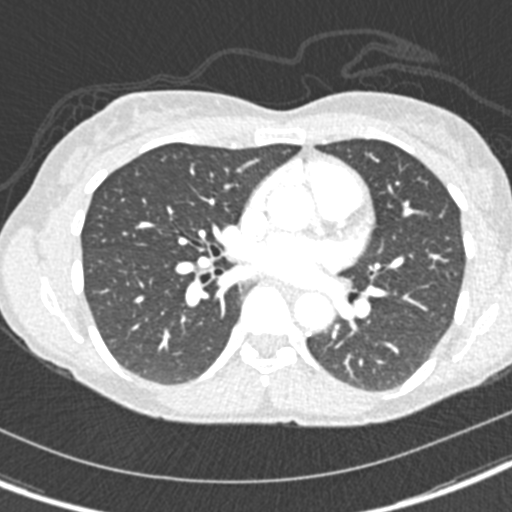
[im 183/301  soft-tissue]
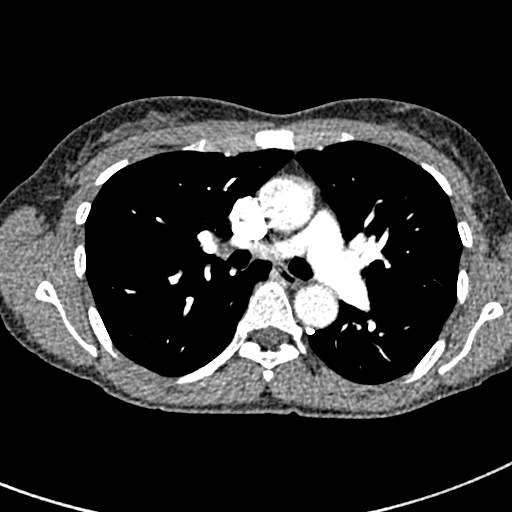
[im 196/301  lung]
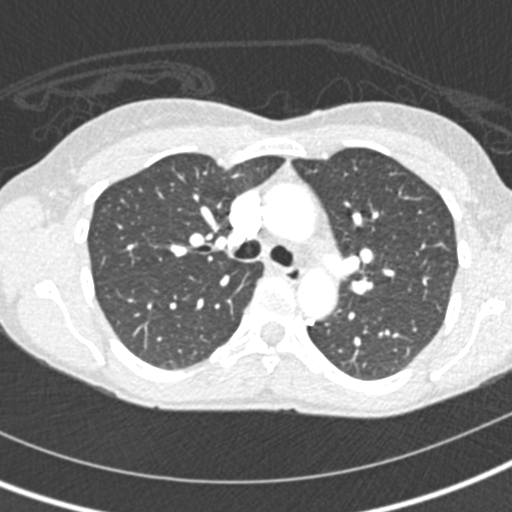
[im 209/301  soft-tissue]
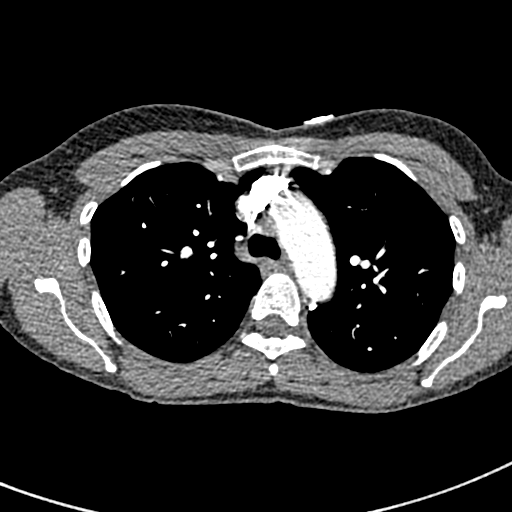
[im 235/301  lung]
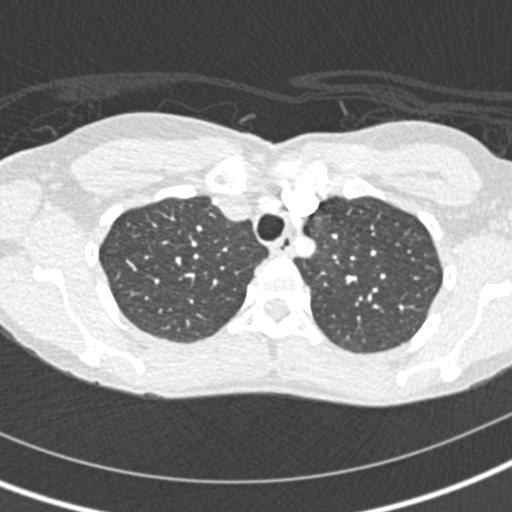
[im 248/301  soft-tissue]
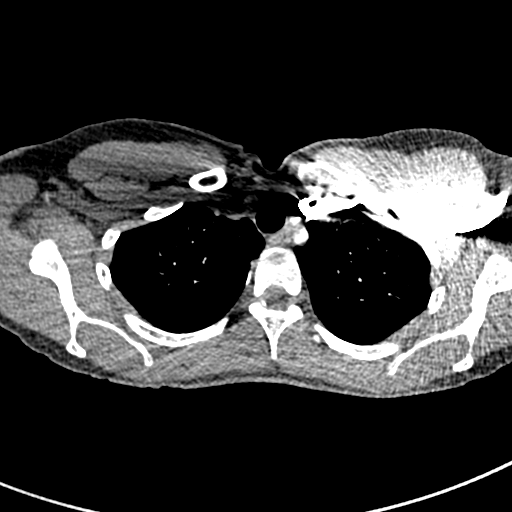
[im 261/301  lung]
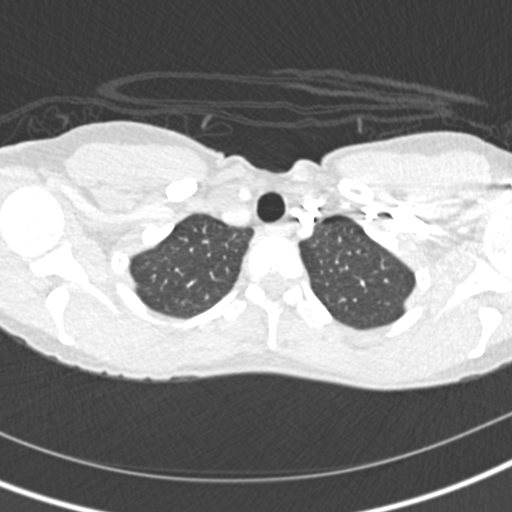
[im 287/301  soft-tissue]
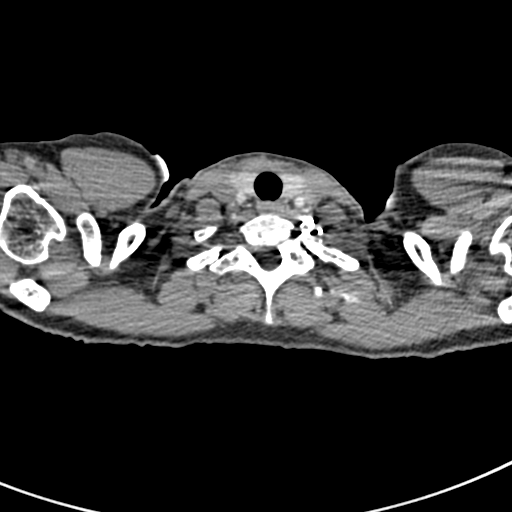

[Series 9: coronal mpr · coronal · 0.59mm/px · 3 of 85 slices shown]
[im 22/85  soft-tissue]
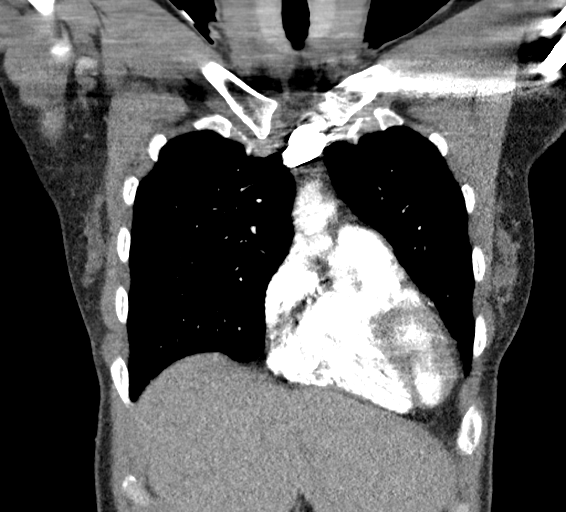
[im 43/85  soft-tissue]
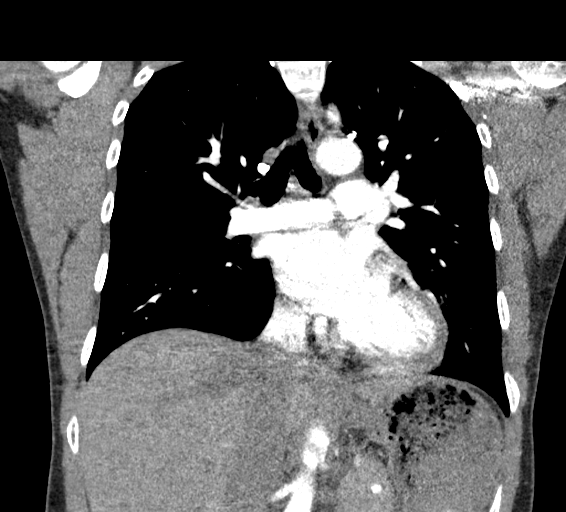
[im 64/85  soft-tissue]
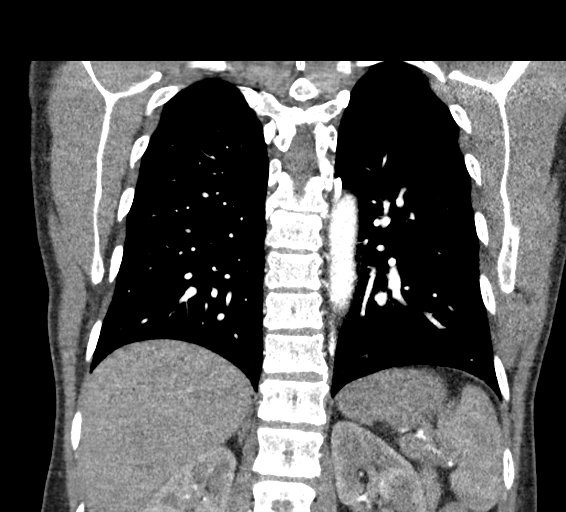

[19 of 46 positions shown; findings below may reference images not displayed]

FINDINGS: There is no evidence of pulmonary embolus.

The lungs are clear bilaterally. There is no evidence of significant
focal consolidation, pleural effusion or pneumothorax. No masses are
identified; no abnormal focal contrast enhancement is seen.

The mediastinum is unremarkable in appearance. No mediastinal
lymphadenopathy is seen. No pericardial effusion is identified. The
great vessels are grossly unremarkable. No axillary lymphadenopathy
is seen. The visualized portions of the thyroid gland are
unremarkable in appearance.

The visualized portions of the liver and spleen are unremarkable.
The visualized portions of the pancreas, adrenal glands and kidneys
are unremarkable. The patient is status post cholecystectomy, with
clips noted at the gallbladder fossa.

No acute osseous abnormalities are seen.

Review of the MIP images confirms the above findings.
IMPRESSION: No evidence of pulmonary embolus.  Lungs clear bilaterally.

## 2019-06-08 ENCOUNTER — Encounter: Payer: Self-pay | Admitting: Gastroenterology
